# Patient Record
Sex: Female | Born: 1955
Health system: Southern US, Community
[De-identification: ages and names within clinical notes are randomized; demographics above are authoritative.]

## PROBLEM LIST (undated history)

## (undated) DIAGNOSIS — R112 Nausea with vomiting, unspecified: Secondary | ICD-10-CM

## (undated) DIAGNOSIS — J42 Unspecified chronic bronchitis: Secondary | ICD-10-CM

## (undated) DIAGNOSIS — T4145XA Adverse effect of unspecified anesthetic, initial encounter: Secondary | ICD-10-CM

## (undated) DIAGNOSIS — F419 Anxiety disorder, unspecified: Secondary | ICD-10-CM

## (undated) DIAGNOSIS — K219 Gastro-esophageal reflux disease without esophagitis: Secondary | ICD-10-CM

## (undated) DIAGNOSIS — N390 Urinary tract infection, site not specified: Secondary | ICD-10-CM

## (undated) DIAGNOSIS — J189 Pneumonia, unspecified organism: Secondary | ICD-10-CM

## (undated) DIAGNOSIS — M1990 Unspecified osteoarthritis, unspecified site: Secondary | ICD-10-CM

## (undated) DIAGNOSIS — T8859XA Other complications of anesthesia, initial encounter: Secondary | ICD-10-CM

## (undated) DIAGNOSIS — Z9889 Other specified postprocedural states: Secondary | ICD-10-CM

## (undated) HISTORY — PX: TRIGGER FINGER RELEASE: SHX641

## (undated) HISTORY — PX: JOINT REPLACEMENT: SHX530

## (undated) HISTORY — PX: CATARACT EXTRACTION W/ INTRAOCULAR LENS  IMPLANT, BILATERAL: SHX1307

---

## 1979-03-30 HISTORY — PX: TUBAL LIGATION: SHX77

## 1997-03-29 HISTORY — PX: VAGINAL HYSTERECTOMY: SUR661

## 1997-03-29 HISTORY — PX: DILATION AND CURETTAGE OF UTERUS: SHX78

## 2002-03-29 DIAGNOSIS — J189 Pneumonia, unspecified organism: Secondary | ICD-10-CM

## 2002-03-29 HISTORY — DX: Pneumonia, unspecified organism: J18.9

## 2006-03-29 HISTORY — PX: RHINOPLASTY: SUR1284

## 2011-10-02 ENCOUNTER — Other Ambulatory Visit (HOSPITAL_COMMUNITY): Payer: Self-pay | Admitting: Pulmonary Disease

## 2011-10-02 ENCOUNTER — Ambulatory Visit (HOSPITAL_COMMUNITY)
Admission: RE | Admit: 2011-10-02 | Discharge: 2011-10-02 | Disposition: A | Discharge status: Home / Self Care | Payer: BC Managed Care – PPO | Source: Ambulatory Visit | Attending: Pulmonary Disease | Admitting: Pulmonary Disease

## 2011-10-02 DIAGNOSIS — M79609 Pain in unspecified limb: Secondary | ICD-10-CM | POA: Insufficient documentation

## 2011-10-02 DIAGNOSIS — R52 Pain, unspecified: Secondary | ICD-10-CM

## 2011-10-02 DIAGNOSIS — M25569 Pain in unspecified knee: Secondary | ICD-10-CM | POA: Insufficient documentation

## 2014-01-22 ENCOUNTER — Other Ambulatory Visit (HOSPITAL_COMMUNITY): Payer: BC Managed Care – PPO

## 2014-01-30 ENCOUNTER — Inpatient Hospital Stay (HOSPITAL_COMMUNITY)
Admission: RE | Admit: 2014-01-30 | Payer: BC Managed Care – PPO | Source: Ambulatory Visit | Admitting: Orthopedic Surgery

## 2014-01-30 ENCOUNTER — Encounter (HOSPITAL_COMMUNITY): Admission: RE | Payer: Self-pay | Source: Ambulatory Visit

## 2014-01-30 SURGERY — ARTHROPLASTY, KNEE, TOTAL
Anesthesia: General | Site: Knee | Laterality: Left

## 2014-11-05 ENCOUNTER — Ambulatory Visit (HOSPITAL_COMMUNITY)
Admission: RE | Admit: 2014-11-05 | Discharge: 2014-11-05 | Disposition: A | Discharge status: Home / Self Care | Payer: BLUE CROSS/BLUE SHIELD | Source: Ambulatory Visit | Attending: Orthopedic Surgery | Admitting: Orthopedic Surgery

## 2014-11-05 ENCOUNTER — Other Ambulatory Visit (HOSPITAL_COMMUNITY): Payer: Self-pay | Admitting: Orthopedic Surgery

## 2014-11-05 DIAGNOSIS — M7989 Other specified soft tissue disorders: Secondary | ICD-10-CM | POA: Insufficient documentation

## 2014-11-05 DIAGNOSIS — R609 Edema, unspecified: Secondary | ICD-10-CM | POA: Diagnosis not present

## 2014-11-05 NOTE — Progress Notes (Signed)
*  PRELIMINARY RESULTS* Vascular Ultrasound Left lower extremity venous duplex has been completed.  Preliminary findings: negative for DVT. Appears to be ruptured baker's cyst.  Called results to Dr. Renaye Rakers.   Farrel Demark, RDMS, RVT  11/05/2014, 5:44 PM

## 2015-01-09 ENCOUNTER — Ambulatory Visit (INDEPENDENT_AMBULATORY_CARE_PROVIDER_SITE_OTHER): Payer: BLUE CROSS/BLUE SHIELD | Admitting: *Deleted

## 2015-01-09 DIAGNOSIS — Z23 Encounter for immunization: Secondary | ICD-10-CM | POA: Diagnosis not present

## 2015-07-01 DIAGNOSIS — M1712 Unilateral primary osteoarthritis, left knee: Secondary | ICD-10-CM | POA: Diagnosis not present

## 2015-08-01 DIAGNOSIS — L299 Pruritus, unspecified: Secondary | ICD-10-CM | POA: Diagnosis not present

## 2015-08-01 DIAGNOSIS — R030 Elevated blood-pressure reading, without diagnosis of hypertension: Secondary | ICD-10-CM | POA: Diagnosis not present

## 2015-08-01 DIAGNOSIS — L0390 Cellulitis, unspecified: Secondary | ICD-10-CM | POA: Diagnosis not present

## 2015-08-15 DIAGNOSIS — M1712 Unilateral primary osteoarthritis, left knee: Secondary | ICD-10-CM | POA: Diagnosis not present

## 2015-09-01 ENCOUNTER — Other Ambulatory Visit: Payer: Self-pay | Admitting: Physician Assistant

## 2015-09-02 ENCOUNTER — Other Ambulatory Visit: Payer: Self-pay | Admitting: Physician Assistant

## 2015-09-02 DIAGNOSIS — M1712 Unilateral primary osteoarthritis, left knee: Secondary | ICD-10-CM | POA: Diagnosis not present

## 2015-09-02 NOTE — H&P (Signed)
TOTAL KNEE ADMISSION H&P  Patient is being admitted for left total knee arthroplasty.  Subjective:  Chief Complaint:left knee pain.  HPI: Mary Sims, 60 y.o. female, has a history of pain and functional disability in the left knee due to arthritis and has failed non-surgical conservative treatments for greater than 12 weeks to includeNSAID's and/or analgesics, corticosteriod injections and viscosupplementation injections.  Onset of symptoms was gradual, starting 9 years ago with rapidlly worsening course since that time. The patient noted no past surgery on the left knee(s).  Patient currently rates pain in the left knee(s) at 5 out of 10 with activity. Patient has night pain, worsening of pain with activity and weight bearing, pain that interferes with activities of daily living, crepitus and joint swelling.  Patient has evidence of subchondral sclerosis and joint space narrowing by imaging studies. There is no active infection.  There are no active problems to display for this patient.  No past medical history on file.  No past surgical history on file.   (Not in a hospital admission) Allergies not on file  Social History  Substance Use Topics  . Smoking status: Not on file  . Smokeless tobacco: Not on file  . Alcohol Use: Not on file    No family history on file.   Review of Systems  Constitutional: Negative.   HENT: Negative.   Eyes: Negative.   Respiratory: Negative.   Cardiovascular: Negative.   Gastrointestinal: Negative.   Genitourinary: Negative.   Musculoskeletal: Positive for joint pain.  Skin: Negative.   Neurological: Negative.   Endo/Heme/Allergies: Negative.   Psychiatric/Behavioral: Negative.     Objective:  Physical Exam  Constitutional: She is oriented to person, place, and time. She appears well-developed and well-nourished.  HENT:  Head: Normocephalic and atraumatic.  Eyes: EOM are normal. Pupils are equal, round, and reactive to light.  Neck:  Normal range of motion. Neck supple.  Cardiovascular: Normal rate and regular rhythm.   Respiratory: Effort normal and breath sounds normal.  GI: Soft. Bowel sounds are normal.  Musculoskeletal:  Specifically, she has varus thrust and varus alignment, left greater than right.  Tibiofemoral and patellofemoral crepitus, left greater than right.  On the left just about full extension, flexion a little bit better than 90.  She is a little bit better than that on the right.    Neurological: She is alert and oriented to person, place, and time.  Skin: Skin is warm and dry.  Psychiatric: She has a normal mood and affect. Her behavior is normal. Judgment and thought content normal.    Vital signs in last 24 hours: @VSRANGES@  Labs:   There is no height or weight on file to calculate BMI.   Imaging Review Plain radiographs demonstrate severe degenerative joint disease of the left knee(s). The overall alignment ismild varus. The bone quality appears to be fair for age and reported activity level.  Assessment/Plan:  End stage arthritis, left knee   The patient history, physical examination, clinical judgment of the provider and imaging studies are consistent with end stage degenerative joint disease of the left knee(s) and total knee arthroplasty is deemed medically necessary. The treatment options including medical management, injection therapy arthroscopy and arthroplasty were discussed at length. The risks and benefits of total knee arthroplasty were presented and reviewed. The risks due to aseptic loosening, infection, stiffness, patella tracking problems, thromboembolic complications and other imponderables were discussed. The patient acknowledged the explanation, agreed to proceed with the plan and consent was   signed. Patient is being admitted for inpatient treatment for surgery, pain control, PT, OT, prophylactic antibiotics, VTE prophylaxis, progressive ambulation and ADL's and discharge  planning. The patient is planning to be discharged home with home health services

## 2015-09-03 ENCOUNTER — Other Ambulatory Visit (HOSPITAL_COMMUNITY): Payer: Self-pay | Admitting: *Deleted

## 2015-09-03 NOTE — Pre-Procedure Instructions (Signed)
Mary Sims  09/03/2015     Your procedure is scheduled on Wednesday, September 17, 2015 at 10:00 AM.   [facility] at 8:00 AM.   Call this number if you have problems the morning of surgery: [phone]   Any questions prior to day of surgery, please call [phone] between 8 & 4 PM.   Remember:  Do not eat food or drink liquids after midnight Tuesday, 09/16/15.  Take these medicines the morning of surgery with A SIP OF WATER: Alprazolam (Xanax) - if needed, Pantoprazole (Protonix) - if needed  Stop NSAIDS (Meloxicam, Mobic, Aleve, Ibuprofen, etc.) 7 days prior to surgery.   Do not wear jewelry, make-up or nail polish.  Do not wear lotions, powders, or perfumes.  You may wear deodorant.  Do not shave 48 hours prior to surgery.    Do not bring valuables to the hospital.  [facility] any belongings or valuables.  Contacts, dentures or bridgework may not be worn into surgery.  Leave your suitcase in the car.  After surgery it may be brought to your room.  For patients admitted to the hospital, discharge time will be determined by your treatment team.  Special instructions:  [facility] - Preparing for Surgery  Before surgery, you can play an important role.  Because skin is not sterile, your skin needs to be as free of germs as possible.  You can reduce the number of germs on you skin by washing with CHG (chlorahexidine gluconate) soap before surgery.  CHG is an antiseptic cleaner which kills germs and bonds with the skin to continue killing germs even after washing.  Please DO NOT use if you have an allergy to CHG or antibacterial soaps.  If your skin becomes reddened/irritated stop using the CHG and inform your nurse when you arrive at Short Stay.  Do not shave (including legs and underarms) for at least 48 hours prior to the first CHG shower.  You may shave your face.  Please follow these instructions  carefully:   1.  Shower with CHG Soap the night before surgery and the                                morning of Surgery.  2.  If you choose to wash your hair, wash your hair first as usual with your       normal shampoo.  3.  After you shampoo, rinse your hair and body thoroughly to remove the                      Shampoo.  4.  Use CHG as you would any other liquid soap.  You can apply chg directly       to the skin and wash gently with scrungie or a clean washcloth.  5.  Apply the CHG Soap to your body ONLY FROM THE NECK DOWN.        Do not use on open wounds or open sores.  Avoid contact with your eyes, ears, mouth and genitals (private parts).  Wash genitals (private parts) with your normal soap.  6.  Wash thoroughly, paying special attention to the area where your surgery        will be performed.  7.  Thoroughly rinse your body with warm water from the neck down.  8.  DO  NOT shower/wash with your normal soap after using and rinsing off       the CHG Soap.  9.  Pat yourself dry with a clean towel.            10.  Wear clean pajamas.            11.  Place clean sheets on your bed the night of your first shower and do not        sleep with pets.  Day of Surgery  Do not apply any lotions the morning of surgery.  Please wear clean clothes to the hospital.   Please read over the following fact sheets that you were given. Pain Booklet, Coughing and Deep Breathing, MRSA Information and Surgical Site Infection Prevention

## 2015-09-04 ENCOUNTER — Encounter (HOSPITAL_COMMUNITY)
Admission: RE | Admit: 2015-09-04 | Discharge: 2015-09-04 | Disposition: A | Discharge status: Home / Self Care | Payer: BLUE CROSS/BLUE SHIELD | Source: Ambulatory Visit | Attending: Orthopedic Surgery | Admitting: Orthopedic Surgery

## 2015-09-04 ENCOUNTER — Encounter (HOSPITAL_COMMUNITY): Payer: Self-pay

## 2015-09-04 DIAGNOSIS — M1712 Unilateral primary osteoarthritis, left knee: Secondary | ICD-10-CM | POA: Insufficient documentation

## 2015-09-04 DIAGNOSIS — Z01812 Encounter for preprocedural laboratory examination: Secondary | ICD-10-CM | POA: Diagnosis not present

## 2015-09-04 DIAGNOSIS — Z0183 Encounter for blood typing: Secondary | ICD-10-CM | POA: Diagnosis not present

## 2015-09-04 DIAGNOSIS — Z01818 Encounter for other preprocedural examination: Secondary | ICD-10-CM | POA: Insufficient documentation

## 2015-09-04 DIAGNOSIS — K219 Gastro-esophageal reflux disease without esophagitis: Secondary | ICD-10-CM | POA: Insufficient documentation

## 2015-09-04 HISTORY — DX: Unspecified osteoarthritis, unspecified site: M19.90

## 2015-09-04 HISTORY — DX: Gastro-esophageal reflux disease without esophagitis: K21.9

## 2015-09-04 HISTORY — DX: Other specified postprocedural states: Z98.890

## 2015-09-04 HISTORY — DX: Other complications of anesthesia, initial encounter: T88.59XA

## 2015-09-04 HISTORY — DX: Adverse effect of unspecified anesthetic, initial encounter: T41.45XA

## 2015-09-04 HISTORY — DX: Anxiety disorder, unspecified: F41.9

## 2015-09-04 HISTORY — DX: Nausea with vomiting, unspecified: R11.2

## 2015-09-04 LAB — URINALYSIS, ROUTINE W REFLEX MICROSCOPIC
BILIRUBIN URINE: NEGATIVE
GLUCOSE, UA: NEGATIVE mg/dL
Hgb urine dipstick: NEGATIVE
KETONES UR: NEGATIVE mg/dL
Nitrite: NEGATIVE
Protein, ur: NEGATIVE mg/dL
Specific Gravity, Urine: 1.017 (ref 1.005–1.030)
pH: 7 (ref 5.0–8.0)

## 2015-09-04 LAB — CBC
HEMATOCRIT: 41.6 % (ref 36.0–46.0)
Hemoglobin: 12.9 g/dL (ref 12.0–15.0)
MCH: 29.9 pg (ref 26.0–34.0)
MCHC: 31 g/dL (ref 30.0–36.0)
MCV: 96.5 fL (ref 78.0–100.0)
PLATELETS: 234 10*3/uL (ref 150–400)
RBC: 4.31 MIL/uL (ref 3.87–5.11)
RDW: 13.4 % (ref 11.5–15.5)
WBC: 5.7 10*3/uL (ref 4.0–10.5)

## 2015-09-04 LAB — TYPE AND SCREEN
ABO/RH(D): A POS
Antibody Screen: NEGATIVE

## 2015-09-04 LAB — URINE MICROSCOPIC-ADD ON: RBC / HPF: NONE SEEN RBC/hpf (ref 0–5)

## 2015-09-04 LAB — SURGICAL PCR SCREEN
MRSA, PCR: NEGATIVE
Staphylococcus aureus: NEGATIVE

## 2015-09-04 LAB — ABO/RH: ABO/RH(D): A POS

## 2015-09-04 NOTE — Progress Notes (Signed)
Pt denies cardiac history, chest pain or sob. States she has never had any cardiac studies done. Pt's PCP is Dr. Kari BaarsEdward Hawkins.

## 2015-09-05 ENCOUNTER — Other Ambulatory Visit (HOSPITAL_COMMUNITY): Payer: BLUE CROSS/BLUE SHIELD

## 2015-09-05 NOTE — Progress Notes (Addendum)
Anesthesia Chart Review:  Pt is a 60 year old female scheduled for L total knee arthroplasty on 09/17/2015 with Dr. Mckinley Jewelaniel Murphy.   PCP is Dr. Kari BaarsEdward Hawkins.   PMH includes:  Post-op N/V, GERD. Never smoker. BMI 31  Medications include: protonix  Preoperative labs reviewed.    EKG 09/04/15: NSR. Left axis deviation. LBBB.   No old EKG for comparison available. Pt denies every having cardiac testing done. Notified Sherrie in Dr. Greig RightMurphy's office that pt will need to see cardiology prior to surgery.   Rica Mastngela Oluwadara Gorman, FNP-BC [facility]/Anesthesiology Phone: [phone] 09/05/2015 3:36 PM  Addendum:  Pt saw Marcy SalvoBridgette Allison, NP at Dr. Verl DickerGanji's office. Echo and stress test ordered (see below). Pt cleared for surgery.   Nuclear stress test 09/12/15:  1. No evidence ischemia or scar.  2. EF 54%. This is a low risk study.   Echo 09/10/15:  1. LV cavity normal in size. Moderate concentric LVH. Abnormal septal wall motion due to LBBB. Normal diastolic filling pattern. EF 56% 2. LA cavity normal in size 3. Mild to moderate MR 4. Mild TR. No evidence pulmonary HTN.   If no changes, I anticipate pt can proceed with surgery as scheduled.   Rica Mastngela Hoby Kawai, FNP-BC [facility]/Anesthesiology Phone: [phone] 09/16/2015 12:58 PM

## 2015-09-10 DIAGNOSIS — Z01810 Encounter for preprocedural cardiovascular examination: Secondary | ICD-10-CM | POA: Diagnosis not present

## 2015-09-10 DIAGNOSIS — I447 Left bundle-branch block, unspecified: Secondary | ICD-10-CM | POA: Diagnosis not present

## 2015-09-12 DIAGNOSIS — I447 Left bundle-branch block, unspecified: Secondary | ICD-10-CM | POA: Diagnosis not present

## 2015-09-12 DIAGNOSIS — Z01810 Encounter for preprocedural cardiovascular examination: Secondary | ICD-10-CM | POA: Diagnosis not present

## 2015-09-16 MED ORDER — TRANEXAMIC ACID 1000 MG/10ML IV SOLN
1000.0000 mg | INTRAVENOUS | Status: AC
  Administered 2015-09-17: 1000 mg via INTRAVENOUS
  Filled 2015-09-16: qty 10

## 2015-09-16 MED ORDER — VANCOMYCIN HCL IN DEXTROSE 1-5 GM/200ML-% IV SOLN
1000.0000 mg | INTRAVENOUS | Status: AC
  Administered 2015-09-17: 1000 mg via INTRAVENOUS
  Filled 2015-09-16: qty 200

## 2015-09-16 NOTE — Anesthesia Preprocedure Evaluation (Addendum)
Anesthesia Evaluation  Patient identified by MRN, date of birth, ID band Patient awake    Reviewed: Allergy & Precautions, NPO status , Patient's Chart, lab work & pertinent test results  History of Anesthesia Complications (+) PONV and PROLONGED EMERGENCE  Airway Mallampati: II   Neck ROM: Full    Dental  (+) Dental Advisory Given   Pulmonary neg pulmonary ROS,    breath sounds clear to auscultation       Cardiovascular negative cardio ROS   Rhythm:Regular  EKG 08/2014 LBB, Stress and ECHO 08/2015 Normal   Neuro/Psych Anxiety negative neurological ROS  negative psych ROS   GI/Hepatic negative GI ROS, Neg liver ROS, GERD  Medicated,  Endo/Other  negative endocrine ROSObesity BMI 30  Renal/GU negative Renal ROS  negative genitourinary   Musculoskeletal negative musculoskeletal ROS (+)   Abdominal   Peds negative pediatric ROS (+)  Hematology negative hematology ROS (+) 13/41   Anesthesia Other Findings   Reproductive/Obstetrics negative OB ROS                            Anesthesia Physical Anesthesia Plan  ASA: II  Anesthesia Plan: General   Post-op Pain Management:    Induction: Intravenous  Airway Management Planned: Oral ETT  Additional Equipment:   Intra-op Plan:   Post-operative Plan: Extubation in OR  Informed Consent: I have reviewed the patients History and Physical, chart, labs and discussed the procedure including the risks, benefits and alternatives for the proposed anesthesia with the patient or authorized representative who has indicated his/her understanding and acceptance.     Plan Discussed with:   Anesthesia Plan Comments: (Multimodal pain RX)        Anesthesia Quick Evaluation

## 2015-09-17 ENCOUNTER — Encounter (HOSPITAL_COMMUNITY): Payer: Self-pay | Admitting: Surgery

## 2015-09-17 ENCOUNTER — Inpatient Hospital Stay (HOSPITAL_COMMUNITY): Payer: BLUE CROSS/BLUE SHIELD

## 2015-09-17 ENCOUNTER — Encounter (HOSPITAL_COMMUNITY)
Admission: RE | Disposition: A | Discharge status: Home Health | Payer: Self-pay | Source: Ambulatory Visit | Attending: Orthopedic Surgery

## 2015-09-17 ENCOUNTER — Inpatient Hospital Stay (HOSPITAL_COMMUNITY): Payer: BLUE CROSS/BLUE SHIELD | Admitting: Vascular Surgery

## 2015-09-17 ENCOUNTER — Inpatient Hospital Stay (HOSPITAL_COMMUNITY)
Admission: RE | Admit: 2015-09-17 | Discharge: 2015-09-18 | DRG: 470 | Disposition: A | Discharge status: Home Health | Payer: BLUE CROSS/BLUE SHIELD | Source: Ambulatory Visit | Attending: Orthopedic Surgery | Admitting: Orthopedic Surgery

## 2015-09-17 ENCOUNTER — Inpatient Hospital Stay (HOSPITAL_COMMUNITY): Payer: BLUE CROSS/BLUE SHIELD | Admitting: Anesthesiology

## 2015-09-17 DIAGNOSIS — M1712 Unilateral primary osteoarthritis, left knee: Principal | ICD-10-CM | POA: Diagnosis present

## 2015-09-17 DIAGNOSIS — Z96652 Presence of left artificial knee joint: Secondary | ICD-10-CM | POA: Diagnosis not present

## 2015-09-17 DIAGNOSIS — M179 Osteoarthritis of knee, unspecified: Secondary | ICD-10-CM | POA: Diagnosis not present

## 2015-09-17 DIAGNOSIS — D62 Acute posthemorrhagic anemia: Secondary | ICD-10-CM | POA: Diagnosis not present

## 2015-09-17 DIAGNOSIS — F419 Anxiety disorder, unspecified: Secondary | ICD-10-CM | POA: Diagnosis present

## 2015-09-17 DIAGNOSIS — M25562 Pain in left knee: Secondary | ICD-10-CM | POA: Diagnosis not present

## 2015-09-17 DIAGNOSIS — Z471 Aftercare following joint replacement surgery: Secondary | ICD-10-CM | POA: Diagnosis not present

## 2015-09-17 DIAGNOSIS — K219 Gastro-esophageal reflux disease without esophagitis: Secondary | ICD-10-CM | POA: Diagnosis present

## 2015-09-17 DIAGNOSIS — J42 Unspecified chronic bronchitis: Secondary | ICD-10-CM | POA: Diagnosis present

## 2015-09-17 DIAGNOSIS — Z96659 Presence of unspecified artificial knee joint: Secondary | ICD-10-CM

## 2015-09-17 HISTORY — DX: Pneumonia, unspecified organism: J18.9

## 2015-09-17 HISTORY — DX: Urinary tract infection, site not specified: N39.0

## 2015-09-17 HISTORY — DX: Unspecified chronic bronchitis: J42

## 2015-09-17 HISTORY — PX: TOTAL KNEE ARTHROPLASTY: SHX125

## 2015-09-17 SURGERY — ARTHROPLASTY, KNEE, TOTAL
Anesthesia: General | Site: Knee | Laterality: Left

## 2015-09-17 MED ORDER — FENTANYL CITRATE (PF) 100 MCG/2ML IJ SOLN
INTRAMUSCULAR | Status: DC | PRN
  Administered 2015-09-17: 50 ug via INTRAVENOUS
  Administered 2015-09-17 (×3): 100 ug via INTRAVENOUS
  Administered 2015-09-17 (×3): 50 ug via INTRAVENOUS

## 2015-09-17 MED ORDER — SUGAMMADEX SODIUM 200 MG/2ML IV SOLN
INTRAVENOUS | Status: DC | PRN
Start: 2015-09-17 — End: 2015-09-17
  Administered 2015-09-17: 175 mg via INTRAVENOUS

## 2015-09-17 MED ORDER — ACETAMINOPHEN 325 MG PO TABS
ORAL_TABLET | ORAL | Status: AC
  Administered 2015-09-17: 650 mg
  Filled 2015-09-17: qty 2

## 2015-09-17 MED ORDER — BUPIVACAINE HCL 0.5 % IJ SOLN
INTRAMUSCULAR | Status: DC | PRN
  Administered 2015-09-17: 10 mL

## 2015-09-17 MED ORDER — ROCURONIUM BROMIDE 100 MG/10ML IV SOLN
INTRAVENOUS | Status: DC | PRN
  Administered 2015-09-17: 50 mg via INTRAVENOUS

## 2015-09-17 MED ORDER — SODIUM CHLORIDE 0.9 % IR SOLN
Status: DC | PRN
  Administered 2015-09-17: 3000 mL

## 2015-09-17 MED ORDER — PROMETHAZINE HCL 25 MG/ML IJ SOLN: 6.2500 mg | INTRAMUSCULAR | Status: DC | PRN

## 2015-09-17 MED ORDER — CHLORHEXIDINE GLUCONATE 4 % EX LIQD: 60.0000 mL | Freq: Once | CUTANEOUS | Status: DC

## 2015-09-17 MED ORDER — FENTANYL CITRATE (PF) 100 MCG/2ML IJ SOLN
INTRAMUSCULAR | Status: AC
  Administered 2015-09-17: 50 ug via INTRAVENOUS
  Filled 2015-09-17: qty 2

## 2015-09-17 MED ORDER — DOCUSATE SODIUM 100 MG PO CAPS
100.0000 mg | ORAL_CAPSULE | Freq: Two times a day (BID) | ORAL | Status: DC
  Administered 2015-09-17 – 2015-09-18 (×2): 100 mg via ORAL
  Filled 2015-09-17 (×2): qty 1

## 2015-09-17 MED ORDER — APIXABAN 2.5 MG PO TABS: ORAL_TABLET | ORAL | Status: DC

## 2015-09-17 MED ORDER — MIDAZOLAM HCL 5 MG/5ML IJ SOLN
INTRAMUSCULAR | Status: DC | PRN
  Administered 2015-09-17: 2 mg via INTRAVENOUS

## 2015-09-17 MED ORDER — BISACODYL 5 MG PO TBEC: 5.0000 mg | DELAYED_RELEASE_TABLET | Freq: Every day | ORAL | Status: DC | PRN

## 2015-09-17 MED ORDER — ALPRAZOLAM 0.25 MG PO TABS: 0.2500 mg | ORAL_TABLET | Freq: Two times a day (BID) | ORAL | Status: DC | PRN

## 2015-09-17 MED ORDER — PHENOL 1.4 % MT LIQD: 1.0000 | OROMUCOSAL | Status: DC | PRN

## 2015-09-17 MED ORDER — BISACODYL 10 MG RE SUPP: 10.0000 mg | Freq: Every day | RECTAL | Status: DC | PRN

## 2015-09-17 MED ORDER — FENTANYL CITRATE (PF) 250 MCG/5ML IJ SOLN
INTRAMUSCULAR | Status: AC
  Filled 2015-09-17: qty 5

## 2015-09-17 MED ORDER — MEPERIDINE HCL 25 MG/ML IJ SOLN: 6.2500 mg | INTRAMUSCULAR | Status: DC | PRN

## 2015-09-17 MED ORDER — ONDANSETRON HCL 4 MG/2ML IJ SOLN
INTRAMUSCULAR | Status: AC
  Filled 2015-09-17: qty 4

## 2015-09-17 MED ORDER — APIXABAN 2.5 MG PO TABS
2.5000 mg | ORAL_TABLET | Freq: Two times a day (BID) | ORAL | Status: DC
  Administered 2015-09-18: 2.5 mg via ORAL
  Filled 2015-09-17: qty 1

## 2015-09-17 MED ORDER — DIPHENHYDRAMINE HCL 12.5 MG/5ML PO ELIX: 12.5000 mg | ORAL_SOLUTION | ORAL | Status: DC | PRN

## 2015-09-17 MED ORDER — MIDAZOLAM HCL 2 MG/2ML IJ SOLN
INTRAMUSCULAR | Status: AC
  Filled 2015-09-17: qty 2

## 2015-09-17 MED ORDER — ONDANSETRON HCL 4 MG/2ML IJ SOLN
INTRAMUSCULAR | Status: DC | PRN
  Administered 2015-09-17 (×2): 4 mg via INTRAVENOUS

## 2015-09-17 MED ORDER — BUPIVACAINE LIPOSOME 1.3 % IJ SUSP
20.0000 mL | INTRAMUSCULAR | Status: DC
  Filled 2015-09-17: qty 20

## 2015-09-17 MED ORDER — TIZANIDINE HCL 4 MG PO TABS
4.0000 mg | ORAL_TABLET | Freq: Four times a day (QID) | ORAL | Status: DC | PRN
  Administered 2015-09-17 – 2015-09-18 (×2): 4 mg via ORAL
  Filled 2015-09-17 (×2): qty 1

## 2015-09-17 MED ORDER — HYDROMORPHONE HCL 1 MG/ML IJ SOLN
0.5000 mg | INTRAMUSCULAR | Status: DC | PRN
  Administered 2015-09-17 – 2015-09-18 (×2): 1 mg via INTRAVENOUS
  Filled 2015-09-17 (×2): qty 1

## 2015-09-17 MED ORDER — ONDANSETRON HCL 4 MG PO TABS: 4.0000 mg | ORAL_TABLET | Freq: Four times a day (QID) | ORAL | Status: DC | PRN

## 2015-09-17 MED ORDER — POTASSIUM CHLORIDE IN NACL 20-0.9 MEQ/L-% IV SOLN
INTRAVENOUS | Status: DC
  Administered 2015-09-17: 17:00:00 via INTRAVENOUS
  Filled 2015-09-17: qty 1000

## 2015-09-17 MED ORDER — ONDANSETRON HCL 4 MG/2ML IJ SOLN
INTRAMUSCULAR | Status: AC
  Filled 2015-09-17: qty 2

## 2015-09-17 MED ORDER — METOCLOPRAMIDE HCL 5 MG/ML IJ SOLN
5.0000 mg | Freq: Three times a day (TID) | INTRAMUSCULAR | Status: DC | PRN
  Administered 2015-09-17 – 2015-09-18 (×2): 10 mg via INTRAVENOUS
  Filled 2015-09-17 (×2): qty 2

## 2015-09-17 MED ORDER — METOCLOPRAMIDE HCL 5 MG PO TABS: 5.0000 mg | ORAL_TABLET | Freq: Three times a day (TID) | ORAL | Status: DC | PRN

## 2015-09-17 MED ORDER — LACTATED RINGERS IV SOLN: INTRAVENOUS | Status: DC

## 2015-09-17 MED ORDER — CELECOXIB 200 MG PO CAPS
200.0000 mg | ORAL_CAPSULE | Freq: Two times a day (BID) | ORAL | Status: DC
  Administered 2015-09-17 – 2015-09-18 (×2): 200 mg via ORAL
  Filled 2015-09-17 (×2): qty 1

## 2015-09-17 MED ORDER — DEXAMETHASONE SODIUM PHOSPHATE 10 MG/ML IJ SOLN
INTRAMUSCULAR | Status: DC | PRN
  Administered 2015-09-17: 10 mg via INTRAVENOUS

## 2015-09-17 MED ORDER — ONDANSETRON HCL 4 MG PO TABS: 4.0000 mg | ORAL_TABLET | Freq: Three times a day (TID) | ORAL | Status: DC | PRN

## 2015-09-17 MED ORDER — BUPIVACAINE LIPOSOME 1.3 % IJ SUSP
INTRAMUSCULAR | Status: DC | PRN
  Administered 2015-09-17: 20 mL

## 2015-09-17 MED ORDER — ZOLPIDEM TARTRATE 5 MG PO TABS: 5.0000 mg | ORAL_TABLET | Freq: Every evening | ORAL | Status: DC | PRN

## 2015-09-17 MED ORDER — OXYCODONE HCL 5 MG PO TABS
ORAL_TABLET | ORAL | Status: AC
  Administered 2015-09-17: 10 mg via ORAL
  Filled 2015-09-17: qty 2

## 2015-09-17 MED ORDER — MENTHOL 3 MG MT LOZG: 1.0000 | LOZENGE | OROMUCOSAL | Status: DC | PRN

## 2015-09-17 MED ORDER — ACETAMINOPHEN 650 MG RE SUPP: 650.0000 mg | Freq: Four times a day (QID) | RECTAL | Status: DC | PRN

## 2015-09-17 MED ORDER — DEXAMETHASONE SODIUM PHOSPHATE 10 MG/ML IJ SOLN
10.0000 mg | Freq: Once | INTRAMUSCULAR | Status: AC
  Administered 2015-09-18: 10 mg via INTRAVENOUS
  Filled 2015-09-17: qty 1

## 2015-09-17 MED ORDER — PROPOFOL 10 MG/ML IV BOLUS
INTRAVENOUS | Status: DC | PRN
  Administered 2015-09-17: 150 mg via INTRAVENOUS

## 2015-09-17 MED ORDER — ONDANSETRON HCL 4 MG/2ML IJ SOLN
4.0000 mg | Freq: Four times a day (QID) | INTRAMUSCULAR | Status: DC | PRN
  Administered 2015-09-17 (×2): 4 mg via INTRAVENOUS
  Filled 2015-09-17 (×2): qty 2

## 2015-09-17 MED ORDER — OXYCODONE-ACETAMINOPHEN 5-325 MG PO TABS: 1.0000 | ORAL_TABLET | ORAL | Status: DC | PRN

## 2015-09-17 MED ORDER — 0.9 % SODIUM CHLORIDE (POUR BTL) OPTIME
TOPICAL | Status: DC | PRN
  Administered 2015-09-17: 1000 mL

## 2015-09-17 MED ORDER — ROCURONIUM BROMIDE 50 MG/5ML IV SOLN
INTRAVENOUS | Status: AC
  Filled 2015-09-17: qty 1

## 2015-09-17 MED ORDER — FENTANYL CITRATE (PF) 100 MCG/2ML IJ SOLN
INTRAMUSCULAR | Status: AC
  Filled 2015-09-17: qty 2

## 2015-09-17 MED ORDER — VANCOMYCIN HCL IN DEXTROSE 1-5 GM/200ML-% IV SOLN
1000.0000 mg | Freq: Two times a day (BID) | INTRAVENOUS | Status: AC
  Administered 2015-09-18: 1000 mg via INTRAVENOUS
  Filled 2015-09-17: qty 200

## 2015-09-17 MED ORDER — PANTOPRAZOLE SODIUM 40 MG PO TBEC
40.0000 mg | DELAYED_RELEASE_TABLET | Freq: Every day | ORAL | Status: DC
  Administered 2015-09-18: 40 mg via ORAL
  Filled 2015-09-17: qty 1

## 2015-09-17 MED ORDER — DEXAMETHASONE SODIUM PHOSPHATE 10 MG/ML IJ SOLN
INTRAMUSCULAR | Status: AC
  Filled 2015-09-17: qty 1

## 2015-09-17 MED ORDER — OXYCODONE HCL 5 MG PO TABS
5.0000 mg | ORAL_TABLET | ORAL | Status: DC | PRN
  Administered 2015-09-17 – 2015-09-18 (×4): 10 mg via ORAL
  Filled 2015-09-17 (×4): qty 2

## 2015-09-17 MED ORDER — ACETAMINOPHEN 325 MG PO TABS: 650.0000 mg | ORAL_TABLET | Freq: Four times a day (QID) | ORAL | Status: DC | PRN

## 2015-09-17 MED ORDER — POLYETHYLENE GLYCOL 3350 17 G PO PACK: 17.0000 g | PACK | Freq: Every day | ORAL | Status: DC | PRN

## 2015-09-17 MED ORDER — FENTANYL CITRATE (PF) 100 MCG/2ML IJ SOLN
25.0000 ug | INTRAMUSCULAR | Status: DC | PRN
  Administered 2015-09-17 (×3): 50 ug via INTRAVENOUS

## 2015-09-17 MED ORDER — LIDOCAINE 2% (20 MG/ML) 5 ML SYRINGE
INTRAMUSCULAR | Status: AC
  Filled 2015-09-17: qty 5

## 2015-09-17 MED ORDER — PROPOFOL 10 MG/ML IV BOLUS
INTRAVENOUS | Status: AC
  Filled 2015-09-17: qty 20

## 2015-09-17 MED ORDER — LACTATED RINGERS IV SOLN
INTRAVENOUS | Status: DC
  Administered 2015-09-17 (×2): via INTRAVENOUS

## 2015-09-17 MED ORDER — LIDOCAINE HCL (CARDIAC) 20 MG/ML IV SOLN
INTRAVENOUS | Status: DC | PRN
  Administered 2015-09-17: 80 mg via INTRAVENOUS

## 2015-09-17 SURGICAL SUPPLY — 61 items
BANDAGE ELASTIC 4 VELCRO ST LF (GAUZE/BANDAGES/DRESSINGS) ×2 IMPLANT
BANDAGE ELASTIC 6 VELCRO ST LF (GAUZE/BANDAGES/DRESSINGS) ×2 IMPLANT
BANDAGE ESMARK 6X9 LF (GAUZE/BANDAGES/DRESSINGS) ×1 IMPLANT
BENZOIN TINCTURE PRP APPL 2/3 (GAUZE/BANDAGES/DRESSINGS) ×2 IMPLANT
BLADE SAG 18X100X1.27 (BLADE) ×4 IMPLANT
BNDG ESMARK 6X9 LF (GAUZE/BANDAGES/DRESSINGS) ×2
BOWL SMART MIX CTS (DISPOSABLE) ×2 IMPLANT
CAPT KNEE TOTAL 3 ×2 IMPLANT
CEMENT BONE SIMPLEX SPEEDSET (Cement) ×4 IMPLANT
COVER SURGICAL LIGHT HANDLE (MISCELLANEOUS) ×2 IMPLANT
CUFF TOURNIQUET SINGLE 34IN LL (TOURNIQUET CUFF) ×2 IMPLANT
DRAPE EXTREMITY T 121X128X90 (DRAPE) ×2 IMPLANT
DRAPE IMP U-DRAPE 54X76 (DRAPES) ×2 IMPLANT
DRAPE PROXIMA HALF (DRAPES) ×2 IMPLANT
DRAPE U-SHAPE 47X51 STRL (DRAPES) ×2 IMPLANT
DRSG PAD ABDOMINAL 8X10 ST (GAUZE/BANDAGES/DRESSINGS) ×2 IMPLANT
DURAPREP 26ML APPLICATOR (WOUND CARE) ×2 IMPLANT
ELECT CAUTERY BLADE 6.4 (BLADE) ×2 IMPLANT
ELECT REM PT RETURN 9FT ADLT (ELECTROSURGICAL) ×2
ELECTRODE REM PT RTRN 9FT ADLT (ELECTROSURGICAL) ×1 IMPLANT
EVACUATOR 1/8 PVC DRAIN (DRAIN) ×2 IMPLANT
FACESHIELD WRAPAROUND (MASK) ×4 IMPLANT
GAUZE SPONGE 4X4 12PLY STRL (GAUZE/BANDAGES/DRESSINGS) ×2 IMPLANT
GLOVE BIOGEL PI IND STRL 7.0 (GLOVE) ×1 IMPLANT
GLOVE BIOGEL PI INDICATOR 7.0 (GLOVE) ×1
GLOVE ORTHO TXT STRL SZ7.5 (GLOVE) ×2 IMPLANT
GLOVE SURG ORTHO 7.0 STRL STRW (GLOVE) ×2 IMPLANT
GOWN STRL REUS W/ TWL LRG LVL3 (GOWN DISPOSABLE) ×3 IMPLANT
GOWN STRL REUS W/ TWL XL LVL3 (GOWN DISPOSABLE) ×2 IMPLANT
GOWN STRL REUS W/TWL LRG LVL3 (GOWN DISPOSABLE) ×3
GOWN STRL REUS W/TWL XL LVL3 (GOWN DISPOSABLE) ×2
HANDPIECE INTERPULSE COAX TIP (DISPOSABLE) ×1
IMMOBILIZER KNEE 22 UNIV (SOFTGOODS) ×2 IMPLANT
IMMOBILIZER KNEE 24 THIGH 36 (MISCELLANEOUS) IMPLANT
IMMOBILIZER KNEE 24 UNIV (MISCELLANEOUS)
KIT BASIN OR (CUSTOM PROCEDURE TRAY) ×2 IMPLANT
KIT ROOM TURNOVER OR (KITS) ×2 IMPLANT
MANIFOLD NEPTUNE II (INSTRUMENTS) ×2 IMPLANT
NEEDLE 18GX1X1/2 (RX/OR ONLY) (NEEDLE) ×2 IMPLANT
NEEDLE HYPO 25GX1X1/2 BEV (NEEDLE) ×2 IMPLANT
NS IRRIG 1000ML POUR BTL (IV SOLUTION) ×2 IMPLANT
PACK TOTAL JOINT (CUSTOM PROCEDURE TRAY) ×2 IMPLANT
PACK UNIVERSAL I (CUSTOM PROCEDURE TRAY) ×2 IMPLANT
PAD ARMBOARD 7.5X6 YLW CONV (MISCELLANEOUS) ×4 IMPLANT
PAD CAST 4YDX4 CTTN HI CHSV (CAST SUPPLIES) ×1 IMPLANT
PADDING CAST COTTON 4X4 STRL (CAST SUPPLIES) ×1
SET HNDPC FAN SPRY TIP SCT (DISPOSABLE) ×1 IMPLANT
STRIP CLOSURE SKIN 1/2X4 (GAUZE/BANDAGES/DRESSINGS) ×2 IMPLANT
SUCTION FRAZIER HANDLE 10FR (MISCELLANEOUS)
SUCTION TUBE FRAZIER 10FR DISP (MISCELLANEOUS) IMPLANT
SUT MNCRL AB 4-0 PS2 18 (SUTURE) ×2 IMPLANT
SUT VIC AB 0 CT1 27 (SUTURE) ×1
SUT VIC AB 0 CT1 27XBRD ANBCTR (SUTURE) ×1 IMPLANT
SUT VIC AB 1 CTX 36 (SUTURE) ×2
SUT VIC AB 1 CTX36XBRD ANBCTR (SUTURE) ×2 IMPLANT
SUT VIC AB 2-0 CT1 27 (SUTURE) ×2
SUT VIC AB 2-0 CT1 TAPERPNT 27 (SUTURE) ×2 IMPLANT
SYR 50ML LL SCALE MARK (SYRINGE) ×2 IMPLANT
SYR CONTROL 10ML LL (SYRINGE) ×2 IMPLANT
TOWEL OR 17X24 6PK STRL BLUE (TOWEL DISPOSABLE) ×2 IMPLANT
TOWEL OR 17X26 10 PK STRL BLUE (TOWEL DISPOSABLE) ×2 IMPLANT

## 2015-09-17 NOTE — Interval H&P Note (Signed)
History and Physical Interval Note:  09/17/2015 8:25 AM  [name]  has presented today for surgery, with the diagnosis of DJD LEFT KNEE  The various methods of treatment have been discussed with the patient and family. After consideration of risks, benefits and other options for treatment, the patient has consented to  Procedure(s): TOTAL KNEE ARTHROPLASTY (Left) as a surgical intervention .  The patient's history has been reviewed, patient examined, no change in status, stable for surgery.  I have reviewed the patient's chart and labs.  Questions were answered to the patient's satisfaction.     Loreta Aveaniel F Chrys Landgrebe

## 2015-09-17 NOTE — Anesthesia Procedure Notes (Signed)
Procedure Name: Intubation Date/Time: 09/17/2015 11:20 AM Performed by: Army FossaPULLIAM, Wesson Stith DANE Pre-anesthesia Checklist: Patient identified, Emergency Drugs available, Suction available, Patient being monitored and Timeout performed Patient Re-evaluated:Patient Re-evaluated prior to inductionOxygen Delivery Method: Circle system utilized Preoxygenation: Pre-oxygenation with 100% oxygen Intubation Type: IV induction Ventilation: Mask ventilation without difficulty and Oral airway inserted - appropriate to patient size Laryngoscope Size: Mac and 3 Grade View: Grade III Tube type: Oral Tube size: 6.5 mm Number of attempts: 1 Airway Equipment and Method: Patient positioned with wedge pillow and Stylet Placement Confirmation: ETT inserted through vocal cords under direct vision,  positive ETCO2,  CO2 detector and breath sounds checked- equal and bilateral Secured at: 21 cm Tube secured with: Tape Dental Injury: Teeth and Oropharynx as per pre-operative assessment

## 2015-09-17 NOTE — H&P (View-Only) (Signed)
TOTAL KNEE ADMISSION H&P  Patient is being admitted for left total knee arthroplasty.  Subjective:  Chief Complaint:left knee pain.  HPI: Mary Sims, 60 y.o. female, has a history of pain and functional disability in the left knee due to arthritis and has failed non-surgical conservative treatments for greater than 12 weeks to includeNSAID's and/or analgesics, corticosteriod injections and viscosupplementation injections.  Onset of symptoms was gradual, starting 9 years ago with rapidlly worsening course since that time. The patient noted no past surgery on the left knee(s).  Patient currently rates pain in the left knee(s) at 5 out of 10 with activity. Patient has night pain, worsening of pain with activity and weight bearing, pain that interferes with activities of daily living, crepitus and joint swelling.  Patient has evidence of subchondral sclerosis and joint space narrowing by imaging studies. There is no active infection.  There are no active problems to display for this patient.  No past medical history on file.  No past surgical history on file.   (Not in a hospital admission) Allergies not on file  Social History  Substance Use Topics  . Smoking status: Not on file  . Smokeless tobacco: Not on file  . Alcohol Use: Not on file    No family history on file.   Review of Systems  Constitutional: Negative.   HENT: Negative.   Eyes: Negative.   Respiratory: Negative.   Cardiovascular: Negative.   Gastrointestinal: Negative.   Genitourinary: Negative.   Musculoskeletal: Positive for joint pain.  Skin: Negative.   Neurological: Negative.   Endo/Heme/Allergies: Negative.   Psychiatric/Behavioral: Negative.     Objective:  Physical Exam  Constitutional: She is oriented to person, place, and time. She appears well-developed and well-nourished.  HENT:  Head: Normocephalic and atraumatic.  Eyes: EOM are normal. Pupils are equal, round, and reactive to light.  Neck:  Normal range of motion. Neck supple.  Cardiovascular: Normal rate and regular rhythm.   Respiratory: Effort normal and breath sounds normal.  GI: Soft. Bowel sounds are normal.  Musculoskeletal:  Specifically, she has varus thrust and varus alignment, left greater than right.  Tibiofemoral and patellofemoral crepitus, left greater than right.  On the left just about full extension, flexion a little bit better than 90.  She is a little bit better than that on the right.    Neurological: She is alert and oriented to person, place, and time.  Skin: Skin is warm and dry.  Psychiatric: She has a normal mood and affect. Her behavior is normal. Judgment and thought content normal.    Vital signs in last 24 hours: @  Labs:   There is no height or weight on file to calculate BMI.   Imaging Review Plain radiographs demonstrate severe degenerative joint disease of the left knee(s). The overall alignment ismild varus. The bone quality appears to be fair for age and reported activity level.  Assessment/Plan:  End stage arthritis, left knee   The patient history, physical examination, clinical judgment of the provider and imaging studies are consistent with end stage degenerative joint disease of the left knee(s) and total knee arthroplasty is deemed medically necessary. The treatment options including medical management, injection therapy arthroscopy and arthroplasty were discussed at length. The risks and benefits of total knee arthroplasty were presented and reviewed. The risks due to aseptic loosening, infection, stiffness, patella tracking problems, thromboembolic complications and other imponderables were discussed. The patient acknowledged the explanation, agreed to proceed with the plan and consent was  signed. Patient is being admitted for inpatient treatment for surgery, pain control, PT, OT, prophylactic antibiotics, VTE prophylaxis, progressive ambulation and ADL's and discharge  planning. The patient is planning to be discharged home with home health services

## 2015-09-17 NOTE — Anesthesia Postprocedure Evaluation (Signed)
Anesthesia Post Note  Patient: Mary Sims  Procedure(s) Performed: Procedure(s) (LRB): TOTAL KNEE ARTHROPLASTY (Left)  Patient location during evaluation: PACU Anesthesia Type: General Level of consciousness: awake and alert Pain management: pain level controlled Vital Signs Assessment: post-procedure vital signs reviewed and stable Respiratory status: spontaneous breathing, nonlabored ventilation, respiratory function stable and patient connected to nasal cannula oxygen Cardiovascular status: blood pressure returned to baseline and stable Postop Assessment: no signs of nausea or vomiting Anesthetic complications: no    Last Vitals:  Filed Vitals:   09/17/15 1300 09/17/15 1315  BP: 158/98 116/104  Pulse: 78   Temp:    Resp:  18    Last Pain:  Filed Vitals:   09/17/15 1319  PainSc: 9                  Sebastian Acheheodore Maddoxx Burkitt

## 2015-09-17 NOTE — Transfer of Care (Signed)
Immediate Anesthesia Transfer of Care Note  Patient: Mary Sims  Procedure(s) Performed: Procedure(s): TOTAL KNEE ARTHROPLASTY (Left)  Patient Location: PACU  Anesthesia Type:General  Level of Consciousness:  sedated, patient cooperative and responds to stimulation  Airway & Oxygen Therapy:Patient Spontanous Breathing and Patient connected to face mask oxgen  Post-op Assessment:  Report given to PACU RN and Post -op Vital signs reviewed and stable  Post vital signs:  Reviewed and stable  Last Vitals:  Filed Vitals:   09/17/15 0929  BP: 163/90  Pulse: 72  Temp: 36.7 C  Resp: 18    Complications: No apparent anesthesia complications

## 2015-09-17 NOTE — Evaluation (Signed)
Physical Therapy Evaluation Patient Details Name: Mary Sims MRN: [id number] DOB: 07-Jul-1955 Today's Date: 09/17/2015   History of Present Illness  Pt presents for L TKA after many years of OA. PMH: GERD, anxiety  Clinical Impression  Pt is s/p TKA resulting in the deficits listed below (see PT Problem List). Pt transferred bed to chair with min A and RW, did not progress further due to pt nausea.  Pt will benefit from skilled PT to increase their independence and safety with mobility to allow discharge to the venue listed below.      Follow Up Recommendations Home health PT    Equipment Recommendations  3in1 (PT)    Recommendations for Other Services       Precautions / Restrictions Precautions Precautions: Knee Precaution Comments: reviewed proper positioning Restrictions Weight Bearing Restrictions: Yes LLE Weight Bearing: Weight bearing as tolerated      Mobility  Bed Mobility Overal bed mobility: Needs Assistance Bed Mobility: Supine to Sit     Supine to sit: Min assist     General bed mobility comments: vc's for sequencing, pt able to lift leg enought o slide it off bed, min A to lower it slowly to floor  Transfers Overall transfer level: Needs assistance Equipment used: Rolling walker (2 wheeled) Transfers: Sit to/from [employer]Stand;Stand Pivot Transfers Sit to Stand: Min assist Stand pivot transfers: Min assist       General transfer comment: vc's for hand placement  Ambulation/Gait             General Gait Details: NT due to pt with much nausea  Stairs            Wheelchair Mobility    Modified Rankin (Stroke Patients Only)       Balance Overall balance assessment: No apparent balance deficits (not formally assessed)                                           Pertinent Vitals/Pain Pain Assessment: Faces Faces Pain Scale: Hurts even more Pain Location: left knee Pain Descriptors / Indicators: Aching Pain  Intervention(s): Limited activity within patient's tolerance;Monitored during session;Premedicated before session;Repositioned    Home Living Family/patient expects to be discharged to:: Private residence Living Arrangements: Spouse/significant other Available Help at Discharge: Family;Available 24 hours/day Type of Home: House Home Access: Stairs to enter Entrance Stairs-Rails: Right Entrance Stairs-Number of Steps: 4 Home Layout: Multi-level Home Equipment: Walker - 2 wheels;Wheelchair - manual;Cane - single point Additional Comments: pt has split foyer with 8 steps but once she is up those she can stay on that level    Prior Function Level of Independence: Independent         Comments: pt works as Haematologistbank teller. Husband had skull fx at work in April so is currently not working and will be home with her     Hand Dominance        Extremity/Trunk Assessment   Upper Extremity Assessment: Defer to OT evaluation           Lower Extremity Assessment: LLE deficits/detail   LLE Deficits / Details: hip flex 3/5, knee ext 3/5  Cervical / Trunk Assessment: Normal  Communication   Communication: No difficulties  Cognition Arousal/Alertness: Awake/alert Behavior During Therapy: WFL for tasks assessed/performed Overall Cognitive Status: Within Functional Limits for tasks assessed  General Comments General comments (skin integrity, edema, etc.): pt left in zero knee foam    Exercises Total Joint Exercises Ankle Circles/Pumps: AROM;Both;10 reps;Seated Quad Sets: AROM;Both;10 reps;Seated      Assessment/Plan    PT Assessment Patient needs continued PT services  PT Diagnosis Difficulty walking;Acute pain;Abnormality of gait   PT Problem List Decreased strength;Decreased range of motion;Decreased mobility;Decreased knowledge of use of DME;Decreased knowledge of precautions;Pain  PT Treatment Interventions DME instruction;Gait training;Stair  training;Functional mobility training;Therapeutic activities;Therapeutic exercise;Neuromuscular re-education;Patient/family education   PT Goals (Current goals can be found in the Care Plan section) Acute Rehab PT Goals Patient Stated Goal: return to work PT Goal Formulation: With patient Time For Goal Achievement: 09/24/15 Potential to Achieve Goals: Good    Frequency 7X/week   Barriers to discharge Inaccessible home environment will need to practice stairs    Co-evaluation               End of Session Equipment Utilized During Treatment: Gait belt Activity Tolerance: Patient tolerated treatment well Patient left: in chair;with call bell/phone within reach Nurse Communication: Mobility status         Time: 1610-9604 PT Time Calculation (min) (ACUTE ONLY): 20 min   Charges:   PT Evaluation $PT Eval Low Complexity: 1 Procedure     PT G Codes:      Lyanne Co, PT  Acute Rehab Services  [phone]   Lyanne Co 09/17/2015, 4:41 PM

## 2015-09-17 NOTE — Discharge Instructions (Signed)

## 2015-09-17 NOTE — Discharge Summary (Addendum)
Patient ID: Mary Sims MRN: [id number] DOB/AGE: February 01, 1956 60 y.o.  Admit date: 09/17/2015 Discharge date: 09/18/2015  Admission Diagnoses:  Active Problems:   S/P total knee replacement   Discharge Diagnoses:  Same  Past Medical History  Diagnosis Date  . Anxiety   . GERD (gastroesophageal reflux disease)   . Complication of anesthesia     slow to wake up after rhinoplasty  . PONV (postoperative nausea and vomiting)   . Walking pneumonia 2004  . Chronic bronchitis (HCC)   . Arthritis     "elbows, hands, knees, thumbs" (09/17/2015)  . Frequent UTI     "1-2 times/year; every year" (09/17/2015)    Surgeries: Procedure(s): TOTAL KNEE ARTHROPLASTY on 09/17/2015   Consultants:    Discharged Condition: Improved  Hospital Course: Mary Seamela Bodnar is an 60 y.o. female who was admitted 09/17/2015 for operative treatment of primary localized osteoarthritis left knee. Patient has severe unremitting pain that affects sleep, daily activities, and work/hobbies. After pre-op clearance the patient was taken to the operating room on 09/17/2015 and underwent  Procedure(s): TOTAL KNEE ARTHROPLASTY.  Patient with a pre-op Hb of 12.4 developed ABLA on pod#1 with a Hb of 12.0.  She is currently asymptomatic but we will continue to follow.  Patient was given perioperative antibiotics:      Anti-infectives    Start     Dose/Rate Route Frequency Ordered Stop   09/17/15 2300  vancomycin (VANCOCIN) IVPB 1000 mg/200 mL premix     1,000 mg 200 mL/hr over 60 Minutes Intravenous Every 12 hours 09/17/15 1333 09/18/15 0102   09/17/15 1030  vancomycin (VANCOCIN) IVPB 1000 mg/200 mL premix     1,000 mg 200 mL/hr over 60 Minutes Intravenous To ShortStay Surgical 09/16/15 1456 09/17/15 1203       Patient was given sequential compression devices, early ambulation, and chemoprophylaxis to prevent DVT.  Patient benefited maximally from hospital stay and there were no complications.    Recent vital signs:   Patient Vitals for the past 24 hrs:  BP Temp Temp src Pulse Resp SpO2  09/18/15 0521 110/72 mmHg 98 F (36.7 C) Oral 68 17 98 %  09/18/15 0037 100/60 mmHg 98.6 F (37 C) Oral 73 17 98 %  09/17/15 2113 (!) 154/86 mmHg 98.6 F (37 C) Oral 91 17 100 %  09/17/15 1400 - 97.9 F (36.6 C) - 74 - 100 %  09/17/15 1345 (!) 150/89 mmHg - - 73 - 100 %  09/17/15 1330 (!) 150/91 mmHg - - 78 - 100 %  09/17/15 1315 (!) 116/104 mmHg - - 76 18 100 %  09/17/15 1300 (!) 158/98 mmHg - - 78 - -  09/17/15 1250 (!) 164/97 mmHg 97.7 F (36.5 C) - 98 18 100 %     Recent laboratory studies:   Recent Labs  09/18/15 0703  WBC 12.7*  HGB 12.0  HCT 37.9  PLT 287  NA 139  K 3.6  CL 106  CO2 26  BUN 8  CREATININE 0.77  GLUCOSE 129*  CALCIUM 9.2     Discharge Medications:     Medication List    STOP taking these medications        ibuprofen 200 MG tablet  Commonly known as:  ADVIL,MOTRIN     meloxicam 7.5 MG tablet  Commonly known as:  MOBIC      TAKE these medications        ALPRAZolam 0.25 MG tablet  Commonly known as:  XANAX  Take  1 tablet by mouth 2 (two) times daily as needed.     apixaban 2.5 MG Tabs tablet  Commonly known as:  ELIQUIS  Take 1 tab po q12 hours x 14 days following surgery to prevent blood clots     bisacodyl 5 MG EC tablet  Commonly known as:  DULCOLAX  Take 1 tablet (5 mg total) by mouth daily as needed for moderate constipation.     ondansetron 4 MG tablet  Commonly known as:  ZOFRAN  Take 1 tablet (4 mg total) by mouth every 8 (eight) hours as needed for nausea or vomiting.     oxyCODONE-acetaminophen 5-325 MG tablet  Commonly known as:  ROXICET  Take 1-2 tablets by mouth every 4 (four) hours as needed.     pantoprazole 40 MG tablet  Commonly known as:  PROTONIX  Take 1 tablet by mouth daily as needed.     tiZANidine 4 MG tablet  Commonly known as:  ZANAFLEX  Take 1 tablet by mouth every 6 (six) hours as needed for muscle spasms.         Diagnostic Studies: Dg Knee Left Port  09/17/2015  CLINICAL DATA:  Status post left knee arthroplasty. EXAM: PORTABLE LEFT KNEE - 1-2 VIEW COMPARISON:  10/02/2011. FINDINGS: The femoral and tibial prosthetic components are well-seated and aligned. There is no acute fracture or evidence of an operative complication. IMPRESSION: Well-positioned left knee arthroplasty. Electronically Signed   By: Amie Portland M.D.   On: 09/17/2015 18:08    Disposition: Final discharge disposition not confirmed    Follow-up Information    Follow up with Loreta Ave, MD. Schedule an appointment as soon as possible for a visit in 2 weeks.   Specialty:  Orthopedic Surgery   Contact information:   [address] [place] [address] [phone]        Signed: Otilio Saber 09/18/2015, 10:20 AM

## 2015-09-17 NOTE — Progress Notes (Signed)
Report given to jamie rn as caregiver 

## 2015-09-17 NOTE — Progress Notes (Signed)
Orthopedic Tech Progress Note Patient Details:  Mary Sims April 25, 1955 [id number]  CPM Left Knee CPM Left Knee: On Left Knee Flexion (Degrees): 90 Left Knee Extension (Degrees): 0 Additional Comments: trapeze bar patient helper Viewed order from doctor's order list  Nikki DomCrawford, Ileane Sando 09/17/2015, 1:13 PM

## 2015-09-17 NOTE — Op Note (Signed)
NAMConcha Se:  Sims, Mary Sims               ACCOUNT NO.:  [id number]  MEDICAL RECORD NO.:  [id number]  LOCATION:  [facility]                         FACILITY:  [facility]  PHYSICIAN:  Loreta Aveaniel F. Halayna Blane, M.D. DATE OF BIRTH:  1955-08-04  DATE OF PROCEDURE:  09/17/2015 DATE OF DISCHARGE:                              OPERATIVE REPORT   PREOPERATIVE DIAGNOSIS:  Left knee end-stage arthritis, primary generalized.  POSTOPERATIVE DIAGNOSIS:  Left knee end-stage arthritis, primary generalized.  PROCEDURE:  Modified minimally invasive left total knee replacement with Stryker triathlon prosthesis.  Cemented pegged cruciate retained #4 femoral component.  Cemented #4 tibial component, 9-mm CS insert. Cemented resurfacing 32-mm patellar component.  SURGEON:  Loreta Aveaniel F. Lovelyn Sheeran, M.D.  ASSISTANT:  Mikey KirschnerLindsey Stanberry, PA, present throughout the entire case and necessary for timely completion of procedure.  ANESTHESIA:  General.  BLOOD LOSS:  Minimal.  SPECIMENS:  None.  CULTURES:  None.  COMPLICATIONS:  None.  DRESSINGS:  Soft compressive knee immobilizer.  TOURNIQUET TIME:  50 minutes.  DESCRIPTION OF PROCEDURE:  The patient was brought to the operating room.  After adequate anesthesia had been obtained, tourniquet applied. Prepped and draped in usual sterile fashion.  Exsanguinated with elevation of Esmarch and tourniquet inflated to 350 mmHg.  Straight incision above the patella down to tibial tubercle.  Medial arthrotomy, vastus splitting.  Knee exposed.  Flexible intramedullary guide distal femur.  An 8-mm resection 5 degrees of valgus.  Using epicondylar axis, the femur was sized, cut, and fitted for a pegged cruciate retaining #4 component.  Proximal tibial resection with extramedullary guide.  Size #4 component.  Utilizing 9-mm CS insert.  Patella exposed, posterior 10 mm removed, drilled, sized, and fitted for a 32-mm component.  With trials in place, I was pleased with balancing, stability, and  alignment of patellar tracking.  Tibia was marked for rotation and hand reamed. All trials removed.  Copious irrigation with pulse irrigating device. Cement prepared, placed on all components, firmly seated.  Polyethylene attached to the tibia and knee reduced.  Patella held with a clamp. Once the cement hardened, the knee was irrigated again.  Soft tissues injected with Exparel.  Arthrotomy closed with #1 Vicryl with a subcutaneous and subcuticular closure.  Margins were injected with Marcaine.  Sterile compressive dressing applied. Tourniquet deflated and removed.  Knee immobilizer applied.  Anesthesia reversed.  Brought to the recovery room.  Tolerated the surgery well. No complications.     Loreta Aveaniel F. Radha Coggins, M.D.     DFM/MEDQ  D:  09/17/2015  T:  09/17/2015  Job:  [id number]

## 2015-09-18 ENCOUNTER — Encounter (HOSPITAL_COMMUNITY): Payer: Self-pay | Admitting: Orthopedic Surgery

## 2015-09-18 LAB — BASIC METABOLIC PANEL
ANION GAP: 7 (ref 5–15)
BUN: 8 mg/dL (ref 6–20)
CHLORIDE: 106 mmol/L (ref 101–111)
CO2: 26 mmol/L (ref 22–32)
Calcium: 9.2 mg/dL (ref 8.9–10.3)
Creatinine, Ser: 0.77 mg/dL (ref 0.44–1.00)
GFR calc non Af Amer: >60 mL/min (ref >60)
Glucose, Bld: 129 mg/dL — ABNORMAL HIGH (ref 65–99)
POTASSIUM: 3.6 mmol/L (ref 3.5–5.1)
Sodium: 139 mmol/L (ref 135–145)

## 2015-09-18 LAB — CBC
HEMATOCRIT: 37.9 % (ref 36.0–46.0)
HEMOGLOBIN: 12 g/dL (ref 12.0–15.0)
MCH: 30.1 pg (ref 26.0–34.0)
MCHC: 31.7 g/dL (ref 30.0–36.0)
MCV: 95 fL (ref 78.0–100.0)
Platelets: 287 10*3/uL (ref 150–400)
RBC: 3.99 MIL/uL (ref 3.87–5.11)
RDW: 13.3 % (ref 11.5–15.5)
WBC: 12.7 10*3/uL — AB (ref 4.0–10.5)

## 2015-09-18 MED ORDER — HYDROCODONE-ACETAMINOPHEN 7.5-325 MG PO TABS
1.0000 | ORAL_TABLET | ORAL | Status: DC | PRN
  Administered 2015-09-18: 2 via ORAL
  Administered 2015-09-18: 1 via ORAL
  Administered 2015-09-18: 2 via ORAL
  Filled 2015-09-18: qty 2
  Filled 2015-09-18: qty 1
  Filled 2015-09-18: qty 2

## 2015-09-18 NOTE — Progress Notes (Signed)
Reviewed discharge instructions/medication with patient.  Answered all her questions.

## 2015-09-18 NOTE — Progress Notes (Signed)
Physical Therapy Treatment Patient Details Name: Mary Sims MRN: [id number] DOB: 1955-08-09 Today's Date: 09/18/2015    History of Present Illness Pt presents for L TKA after many years of OA. PMH: GERD, anxiety    PT Comments    Pt performed increased gait, reviewed stair training and HEP.  Pt to d/c this pm.    Follow Up Recommendations  Home health PT     Equipment Recommendations  3in1 (PT)    Recommendations for Other Services       Precautions / Restrictions Precautions Precautions: Knee Precaution Comments: reviewed proper positioning Restrictions Weight Bearing Restrictions: Yes LLE Weight Bearing: Weight bearing as tolerated    Mobility  Bed Mobility               General bed mobility comments: Pt received in recliner chair on arrival.  OT ending session.    Transfers Overall transfer level: Needs assistance Equipment used: Rolling walker (2 wheeled) Transfers: Sit to/from Stand Sit to Stand: Supervision Stand pivot transfers: Supervision       General transfer comment: No cues required demonstrated good technique.    Ambulation/Gait Ambulation/Gait assistance: Supervision Ambulation Distance (Feet): 450 Feet Assistive device: Rolling walker (2 wheeled) Gait Pattern/deviations: Step-through pattern;Antalgic;Decreased step length - left     General Gait Details: Cues for sequencing, Cues for L heel strike and cues for increasing weight shifting to L to encourage gait symmetry.    Stairs Stairs: Yes Stairs assistance: Supervision Stair Management: Two rails Number of Stairs: 12 General stair comments: Cues for sequencing and hand placement on railing.    Wheelchair Mobility    Modified Rankin (Stroke Patients Only)       Balance Overall balance assessment: Needs assistance   Sitting balance-Leahy Scale: Good       Standing balance-Leahy Scale: Fair                      Cognition Arousal/Alertness:  Awake/alert Behavior During Therapy: WFL for tasks assessed/performed Overall Cognitive Status: Within Functional Limits for tasks assessed                      Exercises Total Joint Exercises Ankle Circles/Pumps: AROM;Both;10 reps;Supine Quad Sets: AROM;Left;10 reps;Supine Towel Squeeze: AROM;Both;10 reps;Supine Heel Slides: AROM;Left;10 reps;Supine Hip ABduction/ADduction: AROM;Left;10 reps;Supine Straight Leg Raises: AROM;Left;10 reps;Supine Long Arc Quad: AROM;Left;10 reps;Seated Knee Flexion: AAROM;Left;10 reps (with 10 sec hold.  )    General Comments        Pertinent Vitals/Pain Pain Assessment: 0-10 Pain Score: 7  Pain Location: L knee pain, quad pain.   Pain Descriptors / Indicators: Aching;Guarding;Grimacing;Burning Pain Intervention(s): Monitored during session;Repositioned;Ice applied    Home Living                      Prior Function            PT Goals (current goals can now be found in the care plan section) Acute Rehab PT Goals Patient Stated Goal: return to work Potential to Achieve Goals: Good    Frequency  7X/week    PT Plan Current plan remains appropriate    Co-evaluation             End of Session Equipment Utilized During Treatment: Gait belt Activity Tolerance: Patient tolerated treatment well Patient left: in chair;with call bell/phone within reach     Time: 1615-1640 PT Time Calculation (min) (ACUTE ONLY): 25 min  Charges:  $Gait Training:  8-22 mins $Therapeutic Exercise: 8-22 mins                    G Codes:      Mary Sims 09/18/2015, 5:32 PM  Mary Sims, PTA pager [phone]

## 2015-09-18 NOTE — Progress Notes (Signed)
Occupational Therapy Evaluation Patient Details Name: Mary Sims MRN: [id number] DOB: 06-Apr-1955 Today's Date: 09/18/2015    History of Present Illness Pt presents for L TKA after many years of OA. PMH: GERD, anxiety   Clinical Impression   Pt admitted with the above diagnoses and presents with below problem list. Pt will benefit from continued acute OT to address the below listed deficits and maximize independence with BADLs prior to d/c home. PTA pt was independent with ADLs. Pt is currently min guard for LB ADLs and functional mobility/transfers.      Follow Up Recommendations  No OT follow up;Supervision - Intermittent    Equipment Recommendations  3 in 1 bedside comode    Recommendations for Other Services       Precautions / Restrictions Precautions Precautions: Knee Restrictions Weight Bearing Restrictions: Yes LLE Weight Bearing: Weight bearing as tolerated      Mobility Bed Mobility               General bed mobility comments: up in chair  Transfers Overall transfer level: Needs assistance Equipment used: Rolling walker (2 wheeled) Transfers: Sit to/from Stand Sit to Stand: Min guard         General transfer comment: min guard for safety    Balance Overall balance assessment: No apparent balance deficits (not formally assessed)                                          ADL Overall ADL's : Needs assistance/impaired Eating/Feeding: Set up;Sitting   Grooming: Set up;Sitting;Min guard;Standing   Upper Body Bathing: Set up;Sitting   Lower Body Bathing: Min guard;Sit to/from stand   Upper Body Dressing : Set up;Sitting   Lower Body Dressing: Min guard;Sit to/from stand   Toilet Transfer: Min guard;Ambulation;RW;BSC   Toileting- ArchitectClothing Manipulation and Hygiene: Min guard;Sit to/from stand   Tub/ Shower Transfer: Min guard;Walk-in shower;Ambulation;3 in 1;Rolling walker   Functional mobility during ADLs: Min  guard;Rolling walker General ADL Comments: Able to reach down BLE to access feet in seated position. Educated on techniques and DME for ADLs. [facility] aand completed pericare as detailed above.     Vision     Perception     Praxis      Pertinent Vitals/Pain Pain Assessment: 0-10 Pain Score: 6  Pain Location: left knee Pain Descriptors / Indicators: Aching Pain Intervention(s): Monitored during session;Limited activity within patient's tolerance;Repositioned;Premedicated before session     Hand Dominance     Extremity/Trunk Assessment Upper Extremity Assessment Upper Extremity Assessment: Overall WFL for tasks assessed           Communication Communication Communication: No difficulties   Cognition Arousal/Alertness: Awake/alert Behavior During Therapy: WFL for tasks assessed/performed Overall Cognitive Status: Within Functional Limits for tasks assessed                     General Comments       Exercises       Shoulder Instructions      Home Living Family/patient expects to be discharged to:: Private residence Living Arrangements: Spouse/significant other Available Help at Discharge: Family;Available 24 hours/day Type of Home: House Home Access: Stairs to enter Entergy CorporationEntrance Stairs-Number of Steps: 4 Entrance Stairs-Rails: Right Home Layout: Multi-level Alternate Level Stairs-Number of Steps: 8 Alternate Level Stairs-Rails: Right Bathroom Shower/Tub: Producer, television/film/videoWalk-in shower   Bathroom Toilet: Standard  Home Equipment: Walker - 2 wheels;Wheelchair - manual;Cane - single point   Additional Comments: pt has split foyer with 8 steps but once she is up those she can stay on that level      Prior Functioning/Environment Level of Independence: Independent        Comments: pt works as Haematologistbank teller. Husband had skull fx at work in April so is currently not working and will be home with her    OT Diagnosis: Acute pain   OT Problem List:  Decreased knowledge of use of DME or AE;Decreased knowledge of precautions;Pain   OT Treatment/Interventions: Self-care/ADL training;DME and/or AE instruction;Therapeutic activities;Patient/family education;Balance training    OT Goals(Current goals can be found in the care plan section) Acute Rehab OT Goals Patient Stated Goal: return to work OT Goal Formulation: With patient Potential to Achieve Goals: Good ADL Goals Pt Will Transfer to Toilet: with modified independence;ambulating (3n1 over toilet) Pt Will Perform Toileting - Clothing Manipulation and hygiene: with modified independence;sit to/from stand;sitting/lateral leans Pt Will Perform Tub/Shower Transfer: Shower transfer;with modified independence;ambulating;3 in 1;rolling walker  OT Frequency: Min 1X/week   Barriers to D/C:            Co-evaluation              End of Session Equipment Utilized During Treatment: Rolling walker CPM Left Knee CPM Left Knee: Off  Activity Tolerance: Patient tolerated treatment well Patient left: in chair;with call bell/phone within reach;Other (comment) (with PT)   Time: 1610-96040904-0919 OT Time Calculation (min): 15 min Charges:  OT General Charges $OT Visit: 1 Procedure OT Evaluation $OT Eval Low Complexity: 1 Procedure G-Codes:    Pilar GrammesMathews, Yaroslav Gombos H 09/18/2015, 9:34 AM

## 2015-09-18 NOTE — Progress Notes (Signed)
Subjective: 1 Day Post-Op Procedure(s) (LRB): TOTAL KNEE ARTHROPLASTY (Left) Patient reports pain as mild.  Main issue has been nausea.  No vomiting.  No lightheadedness/dizziness, chest pain/sob.    Objective: Vital signs in last 24 hours: Temp:  [97.7 F (36.5 C)-98.6 F (37 C)] 98 F (36.7 C) (06/22 0521) Pulse Rate:  [68-98] 68 (06/22 0521) Resp:  [17-18] 17 (06/22 0521) BP: (100-164)/(60-104) 110/72 mmHg (06/22 0521) SpO2:  [98 %-100 %] 98 % (06/22 0521) Weight:  [81.599 kg (179 lb 14.3 oz)] 81.599 kg (179 lb 14.3 oz) (06/21 0929)  Intake/Output from previous day: 06/21 0701 - 06/22 0700 In: 2580 [P.O.:360; I.V.:2220] Out: 50 [Blood:50] Intake/Output this shift:    No results for input(s): HGB in the last 72 hours. No results for input(s): WBC, RBC, HCT, PLT in the last 72 hours. No results for input(s): NA, K, CL, CO2, BUN, CREATININE, GLUCOSE, CALCIUM in the last 72 hours. No results for input(s): LABPT, INR in the last 72 hours.  Neurologically intact Neurovascular intact Sensation intact distally Intact pulses distally Dorsiflexion/Plantar flexion intact Compartment soft  Assessment/Plan: 1 Day Post-Op Procedure(s) (LRB): TOTAL KNEE ARTHROPLASTY (Left) Advance diet Up with therapy Plan for discharge tomorrow home with hhpt WBAT LLE Dry dressing change prn Will d/c oxy and dilaudid and add norco as patient tolerates this at home   Mary Sims 09/18/2015, 7:11 AM

## 2015-09-18 NOTE — Progress Notes (Signed)
Orthopedic Tech Progress Note Patient Details:  Mary Sims 12-Feb-1956 [id number]  CPM Left Knee CPM Left Knee: On Left Knee Flexion (Degrees): 90 Left Knee Extension (Degrees): 0 Additional Comments: trapeze bar patient helper   [name] 09/18/2015, 3:04 PM

## 2015-09-18 NOTE — Care Management Note (Signed)
Case Management Note  Patient Details  Name: Mary Sims MRN: [id number] Date of Birth: 21-May-1955  Subjective/Objective:   60 yr old female s/p left total knee arthroplasty.             Action/Plan: Case manager spoke with patient concerning home health and DME needs at discharge. Choice was offered for Home Health agency, referral was called to Dor[facility] Church[facility] Fel[facility], [facility] Liaison. Patient has rolling walker, 3in1 and CPM will be delivered to her home. Patient will have family support at discharge.     Expected Discharge Date:    09/18/15              Expected Discharge Plan:  Home w Home Health Services  In-House Referral:     Discharge planning Services  CM Consult  Post Acute Care Choice:  Durable Medical Equipment, Home Health Choice offered to:  Patient  DME Arranged:  3-N-1, CPM [facility]  HH Arranged:  PT [facility]  Status of Service:  Completed, signed off  If discussed at [employer] Meetings, dates discussed:    Additional Comments:  Durenda GuthrieBrady, Dellene Mcgroarty Naomi, RN 09/18/2015, 2:21 PM

## 2015-09-18 NOTE — Progress Notes (Signed)
Physical Therapy Treatment Patient Details Name: Mary Sims MRN: [id number] DOB: 07-04-1955 Today's Date: 09/18/2015    History of Present Illness Pt presents for L TKA after many years of OA. PMH: GERD, anxiety    PT Comments    Pt progressed to gait training.  No bouts of nausea.  Pt performed increased activity and tolerated tx well.    Follow Up Recommendations  Home health PT     Equipment Recommendations  3in1 (PT)    Recommendations for Other Services       Precautions / Restrictions Precautions Precautions: Knee Precaution Comments: reviewed proper positioning Restrictions Weight Bearing Restrictions: Yes LLE Weight Bearing: Weight bearing as tolerated    Mobility  Bed Mobility               General bed mobility comments: Pt received in recliner chair on arrival.  OT ending session.    Transfers Overall transfer level: Needs assistance Equipment used: Rolling walker (2 wheeled) Transfers: Sit to/from Stand Sit to Stand: Min guard Stand pivot transfers: Min guard       General transfer comment: Pt required min guard for safety.  Nno cues required demonstrated good technique.    Ambulation/Gait Ambulation/Gait assistance: Min guard Ambulation Distance (Feet): 300 Feet Assistive device: Rolling walker (2 wheeled) Gait Pattern/deviations: Step-through pattern;Antalgic;Decreased step length - left     General Gait Details: Cues for sequencing, Cues for L heel strike and cues for increasing weight shifting to L to encourage gait symmetry.  Pt fatigues with gait training.  No nausea noted.      Stairs            Wheelchair Mobility    Modified Rankin (Stroke Patients Only)       Balance Overall balance assessment: Needs assistance   Sitting balance-Leahy Scale: Good       Standing balance-Leahy Scale: Fair                      Cognition Arousal/Alertness: Awake/alert Behavior During Therapy: WFL for tasks  assessed/performed Overall Cognitive Status: Within Functional Limits for tasks assessed                      Exercises Total Joint Exercises Ankle Circles/Pumps: AROM;Both;10 reps;Supine Quad Sets: AROM;Left;10 reps;Supine Towel Squeeze: AROM;Both;10 reps;Supine Heel Slides: AROM;Left;10 reps;Supine Hip ABduction/ADduction: AROM;Left;10 reps;Supine Straight Leg Raises: AROM;Left;10 reps;Supine Long Arc Quad: AROM;Left;10 reps;Seated Goniometric ROM: 12-70 degrees.  AROM L knee.      General Comments        Pertinent Vitals/Pain Pain Assessment: 0-10 Pain Score: 7  Pain Location: L knee pain and greater in L quad.   Pain Descriptors / Indicators: Aching;Grimacing;Guarding;Tightness Pain Intervention(s): Monitored during session;Repositioned;Ice applied    Home Living Family/patient expects to be discharged to:: Private residence Living Arrangements: Spouse/significant other Available Help at Discharge: Family;Available 24 hours/day Type of Home: House Home Access: Stairs to enter Entrance Stairs-Rails: Right Home Layout: Multi-level Home Equipment: Walker - 2 wheels;Wheelchair - manual;Cane - single point Additional Comments: pt has split foyer with 8 steps but once she is up those she can stay on that level    Prior Function Level of Independence: Independent      Comments: pt works as Haematologistbank teller. Husband had skull fx at work in April so is currently not working and will be home with her   PT Goals (current goals can now be found in the care plan section) Acute Rehab  PT Goals Patient Stated Goal: return to work Potential to Achieve Goals: Good Progress towards PT goals: Progressing toward goals    Frequency       PT Plan Current plan remains appropriate    Co-evaluation             End of Session Equipment Utilized During Treatment: Gait belt Activity Tolerance: Patient tolerated treatment well Patient left: in chair;with call bell/phone within  reach     Time: 0917-0945 PT Time Calculation (min) (ACUTE ONLY): 28 min  Charges:  $Gait Training: 8-22 mins $Therapeutic Exercise: 8-22 mins                    G Codes:      Florestine Aversimee J Schylar Wuebker 09/18/2015, 9:53 AM Joycelyn RuaAimee Cathern Tahir, PTA pager [phone]

## 2015-09-19 DIAGNOSIS — Z96652 Presence of left artificial knee joint: Secondary | ICD-10-CM | POA: Diagnosis not present

## 2015-09-19 DIAGNOSIS — F419 Anxiety disorder, unspecified: Secondary | ICD-10-CM | POA: Diagnosis not present

## 2015-09-19 DIAGNOSIS — Z471 Aftercare following joint replacement surgery: Secondary | ICD-10-CM | POA: Diagnosis not present

## 2015-09-19 DIAGNOSIS — Z7901 Long term (current) use of anticoagulants: Secondary | ICD-10-CM | POA: Diagnosis not present

## 2015-09-19 DIAGNOSIS — M150 Primary generalized (osteo)arthritis: Secondary | ICD-10-CM | POA: Diagnosis not present

## 2015-09-19 DIAGNOSIS — Z87440 Personal history of urinary (tract) infections: Secondary | ICD-10-CM | POA: Diagnosis not present

## 2015-09-19 DIAGNOSIS — J42 Unspecified chronic bronchitis: Secondary | ICD-10-CM | POA: Diagnosis not present

## 2015-09-19 DIAGNOSIS — K219 Gastro-esophageal reflux disease without esophagitis: Secondary | ICD-10-CM | POA: Diagnosis not present

## 2015-09-22 DIAGNOSIS — Z7901 Long term (current) use of anticoagulants: Secondary | ICD-10-CM | POA: Diagnosis not present

## 2015-09-22 DIAGNOSIS — J42 Unspecified chronic bronchitis: Secondary | ICD-10-CM | POA: Diagnosis not present

## 2015-09-22 DIAGNOSIS — Z87440 Personal history of urinary (tract) infections: Secondary | ICD-10-CM | POA: Diagnosis not present

## 2015-09-22 DIAGNOSIS — F419 Anxiety disorder, unspecified: Secondary | ICD-10-CM | POA: Diagnosis not present

## 2015-09-22 DIAGNOSIS — M150 Primary generalized (osteo)arthritis: Secondary | ICD-10-CM | POA: Diagnosis not present

## 2015-09-22 DIAGNOSIS — Z96652 Presence of left artificial knee joint: Secondary | ICD-10-CM | POA: Diagnosis not present

## 2015-09-22 DIAGNOSIS — K219 Gastro-esophageal reflux disease without esophagitis: Secondary | ICD-10-CM | POA: Diagnosis not present

## 2015-09-22 DIAGNOSIS — Z471 Aftercare following joint replacement surgery: Secondary | ICD-10-CM | POA: Diagnosis not present

## 2015-09-24 DIAGNOSIS — J42 Unspecified chronic bronchitis: Secondary | ICD-10-CM | POA: Diagnosis not present

## 2015-09-24 DIAGNOSIS — F419 Anxiety disorder, unspecified: Secondary | ICD-10-CM | POA: Diagnosis not present

## 2015-09-24 DIAGNOSIS — M150 Primary generalized (osteo)arthritis: Secondary | ICD-10-CM | POA: Diagnosis not present

## 2015-09-24 DIAGNOSIS — Z471 Aftercare following joint replacement surgery: Secondary | ICD-10-CM | POA: Diagnosis not present

## 2015-09-24 DIAGNOSIS — Z96652 Presence of left artificial knee joint: Secondary | ICD-10-CM | POA: Diagnosis not present

## 2015-09-24 DIAGNOSIS — Z7901 Long term (current) use of anticoagulants: Secondary | ICD-10-CM | POA: Diagnosis not present

## 2015-09-24 DIAGNOSIS — Z87440 Personal history of urinary (tract) infections: Secondary | ICD-10-CM | POA: Diagnosis not present

## 2015-09-24 DIAGNOSIS — K219 Gastro-esophageal reflux disease without esophagitis: Secondary | ICD-10-CM | POA: Diagnosis not present

## 2015-09-26 DIAGNOSIS — M150 Primary generalized (osteo)arthritis: Secondary | ICD-10-CM | POA: Diagnosis not present

## 2015-09-26 DIAGNOSIS — K219 Gastro-esophageal reflux disease without esophagitis: Secondary | ICD-10-CM | POA: Diagnosis not present

## 2015-09-26 DIAGNOSIS — F419 Anxiety disorder, unspecified: Secondary | ICD-10-CM | POA: Diagnosis not present

## 2015-09-26 DIAGNOSIS — J42 Unspecified chronic bronchitis: Secondary | ICD-10-CM | POA: Diagnosis not present

## 2015-09-26 DIAGNOSIS — Z96652 Presence of left artificial knee joint: Secondary | ICD-10-CM | POA: Diagnosis not present

## 2015-09-26 DIAGNOSIS — Z7901 Long term (current) use of anticoagulants: Secondary | ICD-10-CM | POA: Diagnosis not present

## 2015-09-26 DIAGNOSIS — Z87440 Personal history of urinary (tract) infections: Secondary | ICD-10-CM | POA: Diagnosis not present

## 2015-09-26 DIAGNOSIS — Z471 Aftercare following joint replacement surgery: Secondary | ICD-10-CM | POA: Diagnosis not present

## 2015-09-29 DIAGNOSIS — J42 Unspecified chronic bronchitis: Secondary | ICD-10-CM | POA: Diagnosis not present

## 2015-09-29 DIAGNOSIS — Z87440 Personal history of urinary (tract) infections: Secondary | ICD-10-CM | POA: Diagnosis not present

## 2015-09-29 DIAGNOSIS — M150 Primary generalized (osteo)arthritis: Secondary | ICD-10-CM | POA: Diagnosis not present

## 2015-09-29 DIAGNOSIS — Z7901 Long term (current) use of anticoagulants: Secondary | ICD-10-CM | POA: Diagnosis not present

## 2015-09-29 DIAGNOSIS — K219 Gastro-esophageal reflux disease without esophagitis: Secondary | ICD-10-CM | POA: Diagnosis not present

## 2015-09-29 DIAGNOSIS — F419 Anxiety disorder, unspecified: Secondary | ICD-10-CM | POA: Diagnosis not present

## 2015-09-29 DIAGNOSIS — Z471 Aftercare following joint replacement surgery: Secondary | ICD-10-CM | POA: Diagnosis not present

## 2015-09-29 DIAGNOSIS — Z96652 Presence of left artificial knee joint: Secondary | ICD-10-CM | POA: Diagnosis not present

## 2015-10-01 ENCOUNTER — Ambulatory Visit: Payer: BLUE CROSS/BLUE SHIELD | Admitting: Internal Medicine

## 2015-10-01 DIAGNOSIS — Z7901 Long term (current) use of anticoagulants: Secondary | ICD-10-CM | POA: Diagnosis not present

## 2015-10-01 DIAGNOSIS — J42 Unspecified chronic bronchitis: Secondary | ICD-10-CM | POA: Diagnosis not present

## 2015-10-01 DIAGNOSIS — Z471 Aftercare following joint replacement surgery: Secondary | ICD-10-CM | POA: Diagnosis not present

## 2015-10-01 DIAGNOSIS — K219 Gastro-esophageal reflux disease without esophagitis: Secondary | ICD-10-CM | POA: Diagnosis not present

## 2015-10-01 DIAGNOSIS — F419 Anxiety disorder, unspecified: Secondary | ICD-10-CM | POA: Diagnosis not present

## 2015-10-01 DIAGNOSIS — Z96652 Presence of left artificial knee joint: Secondary | ICD-10-CM | POA: Diagnosis not present

## 2015-10-01 DIAGNOSIS — M150 Primary generalized (osteo)arthritis: Secondary | ICD-10-CM | POA: Diagnosis not present

## 2015-10-01 DIAGNOSIS — Z87440 Personal history of urinary (tract) infections: Secondary | ICD-10-CM | POA: Diagnosis not present

## 2015-10-03 DIAGNOSIS — M1712 Unilateral primary osteoarthritis, left knee: Secondary | ICD-10-CM | POA: Diagnosis not present

## 2015-10-13 ENCOUNTER — Ambulatory Visit: Payer: BLUE CROSS/BLUE SHIELD | Attending: Orthopedic Surgery | Admitting: Physical Therapy

## 2015-10-13 DIAGNOSIS — R600 Localized edema: Secondary | ICD-10-CM | POA: Insufficient documentation

## 2015-10-13 DIAGNOSIS — M25562 Pain in left knee: Secondary | ICD-10-CM

## 2015-10-13 DIAGNOSIS — M25662 Stiffness of left knee, not elsewhere classified: Secondary | ICD-10-CM

## 2015-10-13 NOTE — Therapy (Signed)
[facility] [address] [place], [place], [address] Phone: [phone]   Fax:  [phone]  Physical Therapy Evaluation  Patient Details  Name: Mary Sims MRN: [id number] Date of Birth: September 11, 1955 Referring Provider: Mckinley Jewel MD  Encounter Date: 10/13/2015      PT End of Session - 10/13/15 0939    Visit Number 1   Number of Visits 16   Date for PT Re-Evaluation 12/08/15   PT Start Time 0904   Activity Tolerance Patient tolerated treatment well   Behavior During Therapy [facility] for tasks assessed/performed      Past Medical History  Diagnosis Date  . Anxiety   . GERD (gastroesophageal reflux disease)   . Complication of anesthesia     slow to wake up after rhinoplasty  . PONV (postoperative nausea and vomiting)   . Walking pneumonia 2004  . Chronic bronchitis (HCC)   . Arthritis     "elbows, hands, knees, thumbs" (09/17/2015)  . Frequent UTI     "1-2 times/year; every year" (09/17/2015)    Past Surgical History  Procedure Laterality Date  . Cataract extraction w/ intraocular lens  implant, bilateral Bilateral 2000s  . Trigger finger release Left ~ 2003    thumb  . Vaginal hysterectomy  1999  . Rhinoplasty  2008  . Total knee arthroplasty Left 09/17/2015  . Joint replacement    . Dilation and curettage of uterus  1999  . Tubal ligation  1981  . Total knee arthroplasty Left 09/17/2015    Procedure: TOTAL KNEE ARTHROPLASTY;  Surgeon: Loreta Ave, MD;  Location: [facility] OR;  Service: Orthopedics;  Laterality: Left;    There were no vitals filed for this visit.       Subjective Assessment - 10/13/15 0936    Subjective I am very pleased with how I am doing.  I was off the walker in a week.   Patient Stated Goals Get back to normal.   Currently in Pain? Yes   Pain Score 4    Pain Location Knee   Pain Orientation Left   Pain Descriptors / Indicators Aching;Numbness   Pain Type Surgical pain   Pain Onset More than  a month ago   Pain Frequency Constant   Aggravating Factors  Riding in car or sleeping.            [facility] PT Assessment - 10/13/15 0001    Assessment   Medical Diagnosis Left total knee replacement.   Referring Provider Mckinley Jewel MD   Onset Date/Surgical Date --  09/17/15 (surgery date).   Precautions   Precaution Comments No ultrasound.   Restrictions   Weight Bearing Restrictions No   Balance Screen   Has the patient fallen in the past 6 months No   Has the patient had a decrease in activity level because of a fear of falling?  No   Is the patient reluctant to leave their home because of a fear of falling?  No   Home Environment   Living Environment Private residence   Home Access Stairs to enter   Entrance Stairs-Rails Right;Left   Prior Function   Level of Independence Independent   Observation/Other Assessments   Observations Sterile bandaids covering incision.   Observation/Other Assessments-Edema    Edema Circumferential   Circumferential Edema   Circumferential - Right Left 4.5 cms    ROM / Strength   AROM / PROM / Strength AROM;Strength   AROM   Overall AROM Comments Full  active left knee extension with active flexion to 95 degrees and passive to 102 degrees.   Strength   Overall Strength Comments Left hip and knee strength= 4+/5.   Palpation   Patella mobility Minimal decrease in left patellar mobility in all directions.   Palpation comment Tender to palpation over medial left knee region and c/o lateral side numbness.   Transfers   Transfers Sit to Stand   Sit to Stand 7: Independent   Ambulation/Gait   Gait Comments The patient is walking without an assistive device and her gait pattern is essentially normal.                                PT Long Term Goals - 10/13/15 0958    PT LONG TERM GOAL #1   Title Ind with a HEP.   Time 8   Period Weeks   Status New   PT LONG TERM GOAL #2   Title Active knee flexion to 120-125  degrees+ so the patient can perform functional tasks and do so with pain not > 2-3/10.   Time 8   Period Weeks   Status New   PT LONG TERM GOAL #3   Title Decrease edema to within 2 cms of non-affected side to assist with pain reduction and range of motion gains.   Time 8   Period Weeks   Status New   PT LONG TERM GOAL #4   Title Perform a reciprocating stair gait with one railing with pain not > 2-3/10.   Time 8   Period Weeks   Status New               Plan - 10/13/15 0940    Clinical Impression Statement The patient underwent a left total knee replacement on 09/17/15.  She is very pleased with her progress thus fr and presented to the clinic without an assistive device.  She had some home health and did very well.  Her pain-level is a 4/10 today but rise to higher levels (5-6/10) when sleeping and riding in a car.     Rehab Potential Excellent   PT Frequency 2x / week   PT Duration 8 weeks   PT Treatment/Interventions ADLs/Self Care Home Management;Cryotherapy;Electrical Stimulation;Therapeutic exercise;Therapeutic activities;Patient/family education;Manual techniques;Vasopneumatic Device;Passive range of motion   PT Next Visit Plan Nustep and progression to bike; Left patellar mobility; strengthening; Electrical stimulation and vasopneumatic.   Consulted and Agree with Plan of Care Patient      Patient will benefit from skilled therapeutic intervention in order to improve the following deficits and impairments:  Pain, Decreased activity tolerance, Decreased strength, Increased edema, Decreased range of motion  Visit Diagnosis: Pain in left knee - Plan: PT plan of care cert/re-cert  Stiffness of left knee, not elsewhere classified - Plan: PT plan of care cert/re-cert  Localized edema - Plan: PT plan of care cert/re-cert     Problem List Patient Active Problem List   Diagnosis Date Noted  . S/P total knee replacement 09/17/2015    APPLE[place], [place] MPT 10/13/2015,  10:03 AM  [facility] [address] [place], [place], [address] Phone: [phone]   Fax:  [phone]  Name: Mary Sims MRN: [id number] Date of Birth: 1956-03-16

## 2015-10-16 ENCOUNTER — Ambulatory Visit: Payer: BLUE CROSS/BLUE SHIELD | Admitting: Physical Therapy

## 2015-10-16 ENCOUNTER — Encounter: Payer: Self-pay | Admitting: Physical Therapy

## 2015-10-16 DIAGNOSIS — R600 Localized edema: Secondary | ICD-10-CM

## 2015-10-16 DIAGNOSIS — M25562 Pain in left knee: Secondary | ICD-10-CM | POA: Diagnosis not present

## 2015-10-16 DIAGNOSIS — M25662 Stiffness of left knee, not elsewhere classified: Secondary | ICD-10-CM | POA: Diagnosis not present

## 2015-10-16 NOTE — Therapy (Signed)
[facility] [address] [place], [place], [address] Phone: [phone]   Fax:  [phone]  Physical Therapy Treatment  Patient Details  Name: Mary Sims MRN: [id number] Date of Birth: 1955/06/25 Referring Provider: Mckinley Jewelaniel Murphy MD  Encounter Date: 10/16/2015      PT End of Session - 10/16/15 0901    Visit Number 2   Number of Visits 16   Date for PT Re-Evaluation 12/08/15   PT Start Time 0903   PT Stop Time 0951   PT Time Calculation (min) 48 min   Activity Tolerance Patient tolerated treatment well   Behavior During Therapy [facility] for tasks assessed/performed      Past Medical History  Diagnosis Date  . Anxiety   . GERD (gastroesophageal reflux disease)   . Complication of anesthesia     slow to wake up after rhinoplasty  . PONV (postoperative nausea and vomiting)   . Walking pneumonia 2004  . Chronic bronchitis (HCC)   . Arthritis     "elbows, hands, knees, thumbs" (09/17/2015)  . Frequent UTI     "1-2 times/year; every year" (09/17/2015)    Past Surgical History  Procedure Laterality Date  . Cataract extraction w/ intraocular lens  implant, bilateral Bilateral 2000s  . Trigger finger release Left ~ 2003    thumb  . Vaginal hysterectomy  1999  . Rhinoplasty  2008  . Total knee arthroplasty Left 09/17/2015  . Joint replacement    . Dilation and curettage of uterus  1999  . Tubal ligation  1981  . Total knee arthroplasty Left 09/17/2015    Procedure: TOTAL KNEE ARTHROPLASTY;  Surgeon: Loreta Aveaniel F Murphy, MD;  [facility];  Service: Orthopedics;  Laterality: Left;    There were no vitals filed for this visit.      Subjective Assessment - 10/16/15 0901    Subjective Reports that her surgery was four weeks ago. Reports that at her incision it just feels tight. Reports that she is a stomach sleeper and cannot tolerate sleeping that way long. Reports that she has many stairs throughout her home and thinks that  ambulating the stairs and walking her dog has really helped her following this surgery. Reports being able to ascend stairs good but descending is more difficult.   Patient Stated Goals Get back to normal.   Currently in Pain? Yes   Pain Score 2    Pain Location Knee   Pain Orientation Left   Pain Descriptors / Indicators Tightness   Pain Type Surgical pain   Pain Onset More than a month ago            [facility] PT Assessment - 10/16/15 0001    Assessment   Medical Diagnosis Left total knee replacement.   Onset Date/Surgical Date 09/17/15   Precautions   Precaution Comments No ultrasound.   Restrictions   Weight Bearing Restrictions No   ROM / Strength   AROM / PROM / Strength AROM   AROM   Overall AROM  Deficits   AROM Assessment Site Knee   Right/Left Knee Left   Left Knee Extension -7   Left Knee Flexion 110                     [facility] Adult PT Treatment/Exercise - 10/16/15 0001    Exercises   Exercises Knee/Hip   Knee/Hip Exercises: Stretches   Active Hamstring Stretch Left;3 reps;30 seconds   Knee/Hip Exercises: Aerobic   Nustep L4  x11 min, seat 8   Knee/Hip Exercises: Standing   Forward Lunges Left;2 sets;10 reps;3 seconds   Rocker Board 3 minutes   Knee/Hip Exercises: Seated   Long Arc Quad Strengthening;Left;2 sets;10 reps;Weights   Long Arc Quad Weight 4 lbs.   Modalities   Modalities Office manager Location L knee   Electrical Stimulation Action IFC   Electrical Stimulation Parameters 1-10 hz x15 min   Electrical Stimulation Goals Pain;Edema   Vasopneumatic   Number Minutes Vasopneumatic  15 minutes   Vasopnuematic Location  Knee   Vasopneumatic Pressure Medium   Vasopneumatic Temperature  44   Manual Therapy   Manual Therapy Soft tissue mobilization   Soft tissue mobilization L knee incision and patella mobilizations to promote proper mobility in supine                      PT Long Term Goals - 10/13/15 1610    PT LONG TERM GOAL #1   Title Ind with a HEP.   Time 8   Period Weeks   Status New   PT LONG TERM GOAL #2   Title Active knee flexion to 120-125 degrees+ so the patient can perform functional tasks and do so with pain not > 2-3/10.   Time 8   Period Weeks   Status New   PT LONG TERM GOAL #3   Title Decrease edema to within 2 cms of non-affected side to assist with pain reduction and range of motion gains.   Time 8   Period Weeks   Status New   PT LONG TERM GOAL #4   Title Perform a reciprocating stair gait with one railing with pain not > 2-3/10.   Time 8   Period Weeks   Status New               Plan - 10/16/15 0940    Clinical Impression Statement Patient tolerated today's treatment well today with no reports of increased pain with any of the exercises. Patient experienced good stretch with both rockerboard and HS stretch. Patient ambulated into clinic today with no AD today and no bandages or steristrips present over incision. Patient's L knee incision moves well and is healed with only very small spots of scabs at inferior and superior most region of the incision. Patient educated that if scabs are knocked off to apply bandaids as to protect the area. Patient completed all exercises well with only minimal multimodal cueing for proper exercise technique. Patient's L knee incision mobilized well in many directions that were assessed. L patella mobility limited minimally into sup/inf directions. AROM of L knee measured as 7-110 deg in supine. Normal modalities response noted following removal of the modalities.    Rehab Potential Excellent   PT Frequency 2x / week   PT Duration 8 weeks   PT Treatment/Interventions ADLs/Self Care Home Management;Cryotherapy;Electrical Stimulation;Therapeutic exercise;Therapeutic activities;Patient/family education;Manual techniques;Vasopneumatic Device;Passive range of motion    PT Next Visit Plan Continue per TKR protocol per MPT POC with modalities PRN for pain/edema.   Consulted and Agree with Plan of Care Patient      Patient will benefit from skilled therapeutic intervention in order to improve the following deficits and impairments:  Pain, Decreased activity tolerance, Decreased strength, Increased edema, Decreased range of motion  Visit Diagnosis: Pain in left knee  Stiffness of left knee, not elsewhere classified  Localized edema     Problem List Patient  Active Problem List   Diagnosis Date Noted  . S/P total knee replacement 09/17/2015    Evelene Croon, PTA 10/16/2015, 10:00 AM  [facility] [address] [place], [place], [address] Phone: [phone]   Fax:  [phone]  Name: Mary Sims MRN: [id number] Date of Birth: [birth year]

## 2015-10-20 ENCOUNTER — Ambulatory Visit: Payer: BLUE CROSS/BLUE SHIELD | Admitting: Physical Therapy

## 2015-10-20 DIAGNOSIS — M25562 Pain in left knee: Secondary | ICD-10-CM

## 2015-10-20 DIAGNOSIS — R600 Localized edema: Secondary | ICD-10-CM | POA: Diagnosis not present

## 2015-10-20 DIAGNOSIS — M25662 Stiffness of left knee, not elsewhere classified: Secondary | ICD-10-CM

## 2015-10-20 NOTE — Therapy (Signed)
[facility] [address] [place], [place], [address] Phone: [phone]   Fax:  [phone]  Physical Therapy Treatment  Patient Details  Name: Mary Sims MRN: [id number] Date of Birth: 06-16-1955 Referring Provider: Mckinley Jewel MD  Encounter Date: 10/20/2015      PT End of Session - 10/20/15 0912    Visit Number 3   Number of Visits 16   Date for PT Re-Evaluation 12/08/15   PT Start Time 0901   PT Stop Time 0950   PT Time Calculation (min) 49 min   Activity Tolerance Patient tolerated treatment well   Behavior During Therapy [facility] for tasks assessed/performed      Past Medical History:  Diagnosis Date  . Anxiety   . Arthritis    "elbows, hands, knees, thumbs" (09/17/2015)  . Chronic bronchitis (HCC)   . Complication of anesthesia    slow to wake up after rhinoplasty  . Frequent UTI    "1-2 times/year; every year" (09/17/2015)  . GERD (gastroesophageal reflux disease)   . PONV (postoperative nausea and vomiting)   . Walking pneumonia 2004    Past Surgical History:  Procedure Laterality Date  . CATARACT EXTRACTION W/ INTRAOCULAR LENS  IMPLANT, BILATERAL Bilateral 2000s  . DILATION AND CURETTAGE OF UTERUS  1999  . JOINT REPLACEMENT    . RHINOPLASTY  2008  . TOTAL KNEE ARTHROPLASTY Left 09/17/2015  . TOTAL KNEE ARTHROPLASTY Left 09/17/2015   Procedure: TOTAL KNEE ARTHROPLASTY;  Surgeon: Loreta Ave, MD;  Location: [facility] OR;  Service: Orthopedics;  Laterality: Left;  . TRIGGER FINGER RELEASE Left ~ 2003   thumb  . TUBAL LIGATION  1981  . VAGINAL HYSTERECTOMY  1999    There were no vitals filed for this visit.      Subjective Assessment - 10/20/15 0920    Subjective Yesterday day morning my knee was very stiff and sore especailly around kneecap.   Pain Score 6    Pain Orientation Left   Pain Descriptors / Indicators Sore   Pain Type Surgical pain   Pain Onset More than a month ago                          [facility] Adult PT Treatment/Exercise - 10/20/15 0001      Knee/Hip Exercises: Aerobic   Nustep Level 4 moving seat forward x 4 to increase range of motion over 17 minutes.     Programme researcher, broadcasting/film/video Location Left knee.   Electrical Stimulation Action IFC   Electrical Stimulation Parameters 1-10 Hz x 15 minutes.   Electrical Stimulation Goals Edema;Pain     Vasopneumatic   Number Minutes Vasopneumatic  15 minutes   Vasopnuematic Location  --  Left knee.   Vasopneumatic Pressure Medium   Vasopneumatic Temperature  --  56 degree.     Manual Therapy   Manual Therapy Soft tissue mobilization   Soft tissue mobilization Left knee STW/M including IASTM and PROM into flexion and extension x 8 minutes.                     PT Long Term Goals - 10/13/15 5284      PT LONG TERM GOAL #1   Title Ind with a HEP.   Time 8   Period Weeks   Status New     PT LONG TERM GOAL #2   Title Active knee flexion to 120-125 degrees+ so the  patient can perform functional tasks and do so with pain not > 2-3/10.   Time 8   Period Weeks   Status New     PT LONG TERM GOAL #3   Title Decrease edema to within 2 cms of non-affected side to assist with pain reduction and range of motion gains.   Time 8   Period Weeks   Status New     PT LONG TERM GOAL #4   Title Perform a reciprocating stair gait with one railing with pain not > 2-3/10.   Time 8   Period Weeks   Status New             Patient will benefit from skilled therapeutic intervention in order to improve the following deficits and impairments:     Visit Diagnosis: Pain in left knee  Stiffness of left knee, not elsewhere classified  Localized edema     Problem List Patient Active Problem List   Diagnosis Date Noted  . S/P total knee replacement 09/17/2015    Mary Sims, [place] 10/20/2015, 10:00 AM  [facility] [address] [place], [place], [address] Phone: [phone]   Fax:  [phone]  Name: Mary Sims MRN: [id number] Date of Birth: 1956/03/28

## 2015-10-22 ENCOUNTER — Ambulatory Visit: Payer: BLUE CROSS/BLUE SHIELD | Admitting: Physical Therapy

## 2015-10-22 DIAGNOSIS — M25562 Pain in left knee: Secondary | ICD-10-CM

## 2015-10-22 DIAGNOSIS — R600 Localized edema: Secondary | ICD-10-CM | POA: Diagnosis not present

## 2015-10-22 DIAGNOSIS — M25662 Stiffness of left knee, not elsewhere classified: Secondary | ICD-10-CM

## 2015-10-22 NOTE — Therapy (Signed)
[facility] [address] [place], [place], [address] Phone: [phone]   Fax:  [phone]  Physical Therapy Treatment  Patient Details  Name: Mary Sims MRN: [id number] Date of Birth: [birth year] Referring Provider: Mckinley Jewel MD  Encounter Date: 10/22/2015    Past Medical History:  Diagnosis Date  . Anxiety   . Arthritis    "elbows, hands, knees, thumbs" (09/17/2015)  . Chronic bronchitis (HCC)   . Complication of anesthesia    slow to wake up after rhinoplasty  . Frequent UTI    "1-2 times/year; every year" (09/17/2015)  . GERD (gastroesophageal reflux disease)   . PONV (postoperative nausea and vomiting)   . Walking pneumonia 2004    Past Surgical History:  Procedure Laterality Date  . CATARACT EXTRACTION W/ INTRAOCULAR LENS  IMPLANT, BILATERAL Bilateral 2000s  . DILATION AND CURETTAGE OF UTERUS  1999  . JOINT REPLACEMENT    . RHINOPLASTY  2008  . TOTAL KNEE ARTHROPLASTY Left 09/17/2015  . TOTAL KNEE ARTHROPLASTY Left 09/17/2015   Procedure: TOTAL KNEE ARTHROPLASTY;  Surgeon: Loreta Ave, MD;  Location: [facility] OR;  Service: Orthopedics;  Laterality: Left;  . TRIGGER FINGER RELEASE Left ~ 2003   thumb  . TUBAL LIGATION  1981  . VAGINAL HYSTERECTOMY  1999    There were no vitals filed for this visit.      Subjective Assessment - 10/22/15 1105    Subjective I'm sore but pleased with my progress.   Patient Stated Goals Get back to normal.   Pain Score 5    Pain Location Knee   Pain Orientation Left   Pain Descriptors / Indicators Sore   Pain Type Surgical pain   Pain Onset More than a month ago      Treatment:  Stationary bike seat 3 x 21 minutes f/b STW/M to anterior knee and quads x 10 minutes f/b IFC and medium vasopneumatic x 15 minutes.  Excellent job.  Progress to seat #2 on bike.                                PT Long Term Goals - 10/13/15 8677      PT LONG TERM GOAL #1   Title Ind with a HEP.   Time 8   Period Weeks   Status New     PT LONG TERM GOAL #2   Title Active knee flexion to 120-125 degrees+ so the patient can perform functional tasks and do so with pain not > 2-3/10.   Time 8   Period Weeks   Status New     PT LONG TERM GOAL #3   Title Decrease edema to within 2 cms of non-affected side to assist with pain reduction and range of motion gains.   Time 8   Period Weeks   Status New     PT LONG TERM GOAL #4   Title Perform a reciprocating stair gait with one railing with pain not > 2-3/10.   Time 8   Period Weeks   Status New             Patient will benefit from skilled therapeutic intervention in order to improve the following deficits and impairments:     Visit Diagnosis: Pain in left knee  Stiffness of left knee, not elsewhere classified  Localized edema     Problem List Patient Active Problem List   Diagnosis Date Noted  .  S/P total knee replacement 09/17/2015    Kincaid Tiger, [place] MPT 10/22/2015, 11:12 AM  [facility] [address] [place], [place], [address] Phone: [phone]   Fax:  [phone]  Name: Mary Sims MRN: [id number] Date of Birth: 10/09/1955

## 2015-10-28 DIAGNOSIS — M1712 Unilateral primary osteoarthritis, left knee: Secondary | ICD-10-CM | POA: Diagnosis not present

## 2015-10-30 ENCOUNTER — Encounter: Payer: Self-pay | Admitting: Physical Therapy

## 2015-10-30 ENCOUNTER — Ambulatory Visit: Payer: BLUE CROSS/BLUE SHIELD | Attending: Orthopedic Surgery | Admitting: Physical Therapy

## 2015-10-30 DIAGNOSIS — M25662 Stiffness of left knee, not elsewhere classified: Secondary | ICD-10-CM | POA: Insufficient documentation

## 2015-10-30 DIAGNOSIS — M25562 Pain in left knee: Secondary | ICD-10-CM | POA: Diagnosis not present

## 2015-10-30 DIAGNOSIS — R600 Localized edema: Secondary | ICD-10-CM | POA: Diagnosis not present

## 2015-10-30 NOTE — Therapy (Signed)
[facility] [address] [place], [place], [address] Phone: [phone]   Fax:  [phone]  Physical Therapy Treatment  Patient Details  Name: Mary Sims MRN: [id number] Date of Birth: May 09, 1955 Referring Provider: Mckinley Jewel MD  Encounter Date: 10/30/2015      PT End of Session - 10/30/15 0926    Visit Number 5   Number of Visits 16   Date for PT Re-Evaluation 12/08/15   PT Start Time 0900   PT Stop Time 0949   PT Time Calculation (min) 49 min   Activity Tolerance Patient tolerated treatment well   Behavior During Therapy [facility] for tasks assessed/performed      Past Medical History:  Diagnosis Date  . Anxiety   . Arthritis    "elbows, hands, knees, thumbs" (09/17/2015)  . Chronic bronchitis (HCC)   . Complication of anesthesia    slow to wake up after rhinoplasty  . Frequent UTI    "1-2 times/year; every year" (09/17/2015)  . GERD (gastroesophageal reflux disease)   . PONV (postoperative nausea and vomiting)   . Walking pneumonia 2004    Past Surgical History:  Procedure Laterality Date  . CATARACT EXTRACTION W/ INTRAOCULAR LENS  IMPLANT, BILATERAL Bilateral 2000s  . DILATION AND CURETTAGE OF UTERUS  1999  . JOINT REPLACEMENT    . RHINOPLASTY  2008  . TOTAL KNEE ARTHROPLASTY Left 09/17/2015  . TOTAL KNEE ARTHROPLASTY Left 09/17/2015   Procedure: TOTAL KNEE ARTHROPLASTY;  Surgeon: Loreta Ave, MD;  Location: [facility] OR;  Service: Orthopedics;  Laterality: Left;  . TRIGGER FINGER RELEASE Left ~ 2003   thumb  . TUBAL LIGATION  1981  . VAGINAL HYSTERECTOMY  1999    There were no vitals filed for this visit.      Subjective Assessment - 10/30/15 0906    Subjective Reports that she has a stitch trying to come out in mid incision. Patient reports she was able to walk around block 1 time and is out of work another 6 weeks per doctor.   Patient Stated Goals Get back to normal.   Currently in Pain? Yes   Pain Score 1     Pain Location Knee   Pain Orientation Left   Pain Descriptors / Indicators Tightness   Pain Type Surgical pain   Pain Onset More than a month ago            [facility] PT Assessment - 10/30/15 0001      Assessment   Medical Diagnosis Left total knee replacement.   Onset Date/Surgical Date 09/17/15   Next MD Visit 12/09/2015     Precautions   Precaution Comments No ultrasound.     Restrictions   Weight Bearing Restrictions No     ROM / Strength   AROM / PROM / Strength AROM     AROM   Overall AROM  Deficits;Within functional limits for tasks performed   AROM Assessment Site Knee   Right/Left Knee Left   Left Knee Extension 0   Left Knee Flexion 110                     [facility] Adult PT Treatment/Exercise - 10/30/15 0001      Knee/Hip Exercises: Aerobic   Stationary Bike L1 x15 min     Knee/Hip Exercises: Machines for Strengthening   Cybex Knee Extension 10# 3x10 reps   Cybex Knee Flexion 20# 3x10 reps     Knee/Hip Exercises: Standing  Hip Flexion Stengthening;Left;2 sets;10 reps;Knee bent  3#   Hip Abduction Stengthening;Left;2 sets;10 reps  3#   Rocker Board 3 minutes     Modalities   Modalities Electrical Stimulation;Vasopneumatic     Programme researcher, broadcasting/film/video Location L knee   Electrical Stimulation Action IFC   Electrical Stimulation Parameters 1-10 hz x15 min   Electrical Stimulation Goals Edema;Pain     Vasopneumatic   Number Minutes Vasopneumatic  15 minutes   Vasopnuematic Location  Knee   Vasopneumatic Pressure Medium   Vasopneumatic Temperature  34                     PT Long Term Goals - 10/30/15 0944      PT LONG TERM GOAL #1   Title Ind with a HEP.   Time 8   Period Weeks   Status New     PT LONG TERM GOAL #2   Title Active knee flexion to 120-125 degrees+ so the patient can perform functional tasks and do so with pain not > 2-3/10.   Time 8   Period Weeks   Status On-going  AROM L  knee 0-110 deg 10/30/2015     PT LONG TERM GOAL #3   Title Decrease edema to within 2 cms of non-affected side to assist with pain reduction and range of motion gains.   Time 8   Period Weeks   Status On-going     PT LONG TERM GOAL #4   Title Perform a reciprocating stair gait with one railing with pain not > 2-3/10.   Time 8   Period Weeks   Status On-going               Plan - 10/30/15 0950    Clinical Impression Statement Patient tolerated today's treatment well and was able to tolerate machine knee strengthening well with no reports of discomfort. Patient able to tolerate standing L hip strengthening well with 3# with no complaints as well. AROM L knee measured as 0-110 deg in supine. Normal modalities response noted following removal of the modalities.    Rehab Potential Excellent   PT Frequency 2x / week   PT Duration 8 weeks   PT Treatment/Interventions ADLs/Self Care Home Management;Cryotherapy;Electrical Stimulation;Therapeutic exercise;Therapeutic activities;Patient/family education;Manual techniques;Vasopneumatic Device;Passive range of motion   PT Next Visit Plan Continue per TKR protocol per MPT POC with modalities PRN for pain/edema.   Consulted and Agree with Plan of Care Patient      Patient will benefit from skilled therapeutic intervention in order to improve the following deficits and impairments:  Pain, Decreased activity tolerance, Decreased strength, Increased edema, Decreased range of motion  Visit Diagnosis: Pain in left knee  Stiffness of left knee, not elsewhere classified  Localized edema     Problem List Patient Active Problem List   Diagnosis Date Noted  . S/P total knee replacement 09/17/2015    Evelene Croon, PTA 10/30/2015, 9:53 AM  [facility] [address] [place], [place], [address] Phone: [phone]   Fax:  [phone]  Name: Mary Sims MRN: [id number] Date of Birth:  [birth year]

## 2015-10-31 ENCOUNTER — Ambulatory Visit: Payer: BLUE CROSS/BLUE SHIELD | Admitting: Physical Therapy

## 2015-10-31 DIAGNOSIS — R600 Localized edema: Secondary | ICD-10-CM

## 2015-10-31 DIAGNOSIS — M25562 Pain in left knee: Secondary | ICD-10-CM | POA: Diagnosis not present

## 2015-10-31 DIAGNOSIS — M25662 Stiffness of left knee, not elsewhere classified: Secondary | ICD-10-CM | POA: Diagnosis not present

## 2015-10-31 NOTE — Therapy (Signed)
[facility] [address] [place], [place], [address] Phone: [phone]   Fax:  [phone]  Physical Therapy Treatment  Patient Details  Name: Mary Sims MRN: [id number] Date of Birth: 10/09/1955 Referring Provider: Mckinley Jewel MD  Encounter Date: 10/31/2015      PT End of Session - 10/31/15 1114    Visit Number 6   Number of Visits 16   Date for PT Re-Evaluation 12/08/15   PT Start Time 0945   PT Stop Time 1047   PT Time Calculation (min) 62 min   Activity Tolerance Patient tolerated treatment well   Behavior During Therapy [facility] for tasks assessed/performed      Past Medical History:  Diagnosis Date  . Anxiety   . Arthritis    "elbows, hands, knees, thumbs" (09/17/2015)  . Chronic bronchitis (HCC)   . Complication of anesthesia    slow to wake up after rhinoplasty  . Frequent UTI    "1-2 times/year; every year" (09/17/2015)  . GERD (gastroesophageal reflux disease)   . PONV (postoperative nausea and vomiting)   . Walking pneumonia 2004    Past Surgical History:  Procedure Laterality Date  . CATARACT EXTRACTION W/ INTRAOCULAR LENS  IMPLANT, BILATERAL Bilateral 2000s  . DILATION AND CURETTAGE OF UTERUS  1999  . JOINT REPLACEMENT    . RHINOPLASTY  2008  . TOTAL KNEE ARTHROPLASTY Left 09/17/2015  . TOTAL KNEE ARTHROPLASTY Left 09/17/2015   Procedure: TOTAL KNEE ARTHROPLASTY;  Surgeon: Loreta Ave, MD;  Location: [facility] OR;  Service: Orthopedics;  Laterality: Left;  . TRIGGER FINGER RELEASE Left ~ 2003   thumb  . TUBAL LIGATION  1981  . VAGINAL HYSTERECTOMY  1999    There were no vitals filed for this visit.      Subjective Assessment - 10/31/15 1115    Subjective I'm pleased with my progress.  I'm going to the beach.   Patient Stated Goals Get back to normal.   Pain Score 1    Pain Location Knee   Pain Orientation Left   Pain Descriptors / Indicators Tightness   Pain Type Surgical pain   Pain Onset More than  a month ago     Treatment:  Stationary bike x 15 minutes f/b knee extension with 10# x 6 minutes f/b ham curls 30# x 6 minutes f/b Leg press with 2 plates x 5 minutes f/b instruct in seated flexion using right LE and prone sheet stretch f/b PROM into left knee flexion x 5 minutes.  Achieved passive left knee flexion to 115 degrees today.  Medium vasopneumatic a IFC at 1-10 Hz x 15 minutes.  Excellent job.                                 PT Long Term Goals - 10/30/15 0944      PT LONG TERM GOAL #1   Title Ind with a HEP.   Time 8   Period Weeks   Status New     PT LONG TERM GOAL #2   Title Active knee flexion to 120-125 degrees+ so the patient can perform functional tasks and do so with pain not > 2-3/10.   Time 8   Period Weeks   Status On-going  AROM L knee 0-110 deg 10/30/2015     PT LONG TERM GOAL #3   Title Decrease edema to within 2 cms of non-affected side to assist with  pain reduction and range of motion gains.   Time 8   Period Weeks   Status On-going     PT LONG TERM GOAL #4   Title Perform a reciprocating stair gait with one railing with pain not > 2-3/10.   Time 8   Period Weeks   Status On-going               Plan - 10/30/15 0950    Clinical Impression Statement Patient tolerated today's treatment well and was able to tolerate machine knee strengthening well with no reports of discomfort. Patient able to tolerate standing L hip strengthening well with 3# with no complaints as well. AROM L knee measured as 0-110 deg in supine. Normal modalities response noted following removal of the modalities.    Rehab Potential Excellent   PT Frequency 2x / week   PT Duration 8 weeks   PT Treatment/Interventions ADLs/Self Care Home Management;Cryotherapy;Electrical Stimulation;Therapeutic exercise;Therapeutic activities;Patient/family education;Manual techniques;Vasopneumatic Device;Passive range of motion   PT Next Visit Plan Continue per TKR  protocol per MPT POC with modalities PRN for pain/edema.   Consulted and Agree with Plan of Care Patient      Patient will benefit from skilled therapeutic intervention in order to improve the following deficits and impairments:     Visit Diagnosis: Pain in left knee  Stiffness of left knee, not elsewhere classified  Localized edema     Problem List Patient Active Problem List   Diagnosis Date Noted  . S/P total knee replacement 09/17/2015    Chritopher Coster, [place] MPT 10/31/2015, 12:12 PM  [facility] [address] [place], [place], [address] Phone: [phone]   Fax:  [phone]  Name: Mary Sims MRN: [id number] Date of Birth: 01-14-1956

## 2015-11-10 ENCOUNTER — Ambulatory Visit: Payer: BLUE CROSS/BLUE SHIELD | Admitting: Physical Therapy

## 2015-11-10 ENCOUNTER — Encounter: Payer: Self-pay | Admitting: Physical Therapy

## 2015-11-10 DIAGNOSIS — R600 Localized edema: Secondary | ICD-10-CM | POA: Diagnosis not present

## 2015-11-10 DIAGNOSIS — M25662 Stiffness of left knee, not elsewhere classified: Secondary | ICD-10-CM

## 2015-11-10 DIAGNOSIS — M25562 Pain in left knee: Secondary | ICD-10-CM

## 2015-11-10 NOTE — Therapy (Signed)
[facility] [address] [place], [place], [address] Phone: [phone]   Fax:  [phone]  Physical Therapy Treatment  Patient Details  Name: Mary Sims MRN: [id number] Date of Birth: 22-Jul-1955 Referring Provider: Mckinley Jewelaniel Murphy MD  Encounter Date: 11/10/2015      PT End of Session - 11/10/15 0908    Visit Number 7   Number of Visits 16   Date for PT Re-Evaluation 12/08/15   PT Start Time 0905   PT Stop Time 0956   PT Time Calculation (min) 51 min   Activity Tolerance Patient tolerated treatment well   Behavior During Therapy [facility] for tasks assessed/performed      Past Medical History:  Diagnosis Date  . Anxiety   . Arthritis    "elbows, hands, knees, thumbs" (09/17/2015)  . Chronic bronchitis (HCC)   . Complication of anesthesia    slow to wake up after rhinoplasty  . Frequent UTI    "1-2 times/year; every year" (09/17/2015)  . GERD (gastroesophageal reflux disease)   . PONV (postoperative nausea and vomiting)   . Walking pneumonia 2004    Past Surgical History:  Procedure Laterality Date  . CATARACT EXTRACTION W/ INTRAOCULAR LENS  IMPLANT, BILATERAL Bilateral 2000s  . DILATION AND CURETTAGE OF UTERUS  1999  . JOINT REPLACEMENT    . RHINOPLASTY  2008  . TOTAL KNEE ARTHROPLASTY Left 09/17/2015  . TOTAL KNEE ARTHROPLASTY Left 09/17/2015   Procedure: TOTAL KNEE ARTHROPLASTY;  Surgeon: Loreta Aveaniel F Murphy, MD;  [facility];  Service: Orthopedics;  Laterality: Left;  . TRIGGER FINGER RELEASE Left ~ 2003   thumb  . TUBAL LIGATION  1981  . VAGINAL HYSTERECTOMY  1999    There were no vitals filed for this visit.      Subjective Assessment - 11/10/15 0904    Subjective Reports that she has had a lot of swelling since being at the beach last week. Reports swelling down to L ankle and the top of the L foot. Reports she was on her feet and walking a lot on vacation. Reports that she has pain at night at this time.    Patient Stated Goals Get back to normal.   Currently in Pain? No/denies            [facility] - 11/10/15 0001      Assessment   Medical Diagnosis Left total knee replacement.   Onset Date/Surgical Date 09/17/15   Next MD Visit 12/09/2015     Precautions   Precaution Comments No ultrasound.     Restrictions   Weight Bearing Restrictions No     ROM / Strength   AROM / PROM / Strength AROM     AROM   Overall AROM  Deficits   AROM Assessment Site Knee   Right/Left Knee Left   Left Knee Flexion 112                     [facility] Adult PT Treatment/Exercise - 11/10/15 0001      Knee/Hip Exercises: Aerobic   Stationary Bike Seat 4 x15 min     Knee/Hip Exercises: Machines for Strengthening   Cybex Knee Extension 10# 3x10 reps   Cybex Knee Flexion 30# 3x10 reps   Cybex Leg Press Seat 6, 2 plates, 6N624x10 reps     Knee/Hip Exercises: Standing   Forward Lunges Left;2 sets;10 reps;3 seconds     Modalities   Modalities Electrical Stimulation;Vasopneumatic  Programme researcher, broadcasting/film/videolectrical Stimulation   Electrical Stimulation Location L knee   Engineer, manufacturinglectrical Stimulation Action IFC   Electrical Stimulation Parameters 80-150 hz x15 min   Electrical Stimulation Goals Edema;Pain     Vasopneumatic   Number Minutes Vasopneumatic  15 minutes   Vasopnuematic Location  Knee   Vasopneumatic Pressure Medium   Vasopneumatic Temperature  34                PT Education - 11/10/15 0957    Education provided Yes   Education Details HEP- ankle pumps, lunges   Person(s) Educated Patient   Methods Explanation;Verbal cues;Handout   Comprehension Verbalized understanding;Verbal cues required             PT Long Term Goals - 11/10/15 0950      PT LONG TERM GOAL #1   Title Ind with a HEP.   Time 8   Period Weeks   Status New     PT LONG TERM GOAL #2   Title Active knee flexion to 120-125 degrees+ so the patient can perform functional tasks and do so with pain not >  2-3/10.   Time 8   Period Weeks   Status On-going  AROM L knee flexion 112 deg 11/10/2015     PT LONG TERM GOAL #3   Title Decrease edema to within 2 cms of non-affected side to assist with pain reduction and range of motion gains.   Time 8   Period Weeks   Status On-going     PT LONG TERM GOAL #4   Title Perform a reciprocating stair gait with one railing with pain not > 2-3/10.   Time 8   Period Weeks   Status On-going               Plan - 11/10/15 0943    Clinical Impression Statement Patient tolerated today's treatment fairly well although she arrived with increased inflammation and swelling. Patient had no complaints during any of the exercises today. Increased inflammation was noted around patient's L ankle today. Normal modalities response noted following removal of the modalities. AROM L knee flexion measured as 112 deg today in supine. Patient accepted HEP for edema control and lunges to improve ROM into flexion with education for parameters and technique.   Rehab Potential Excellent   PT Frequency 2x / week   PT Duration 8 weeks   PT Treatment/Interventions ADLs/Self Care Home Management;Cryotherapy;Electrical Stimulation;Therapeutic exercise;Therapeutic activities;Patient/family education;Manual techniques;Vasopneumatic Device;Passive range of motion   PT Next Visit Plan Continue per TKR protocol per MPT POC with modalities PRN for pain/edema.   Consulted and Agree with Plan of Care Patient      Patient will benefit from skilled therapeutic intervention in order to improve the following deficits and impairments:  Pain, Decreased activity tolerance, Decreased strength, Increased edema, Decreased range of motion  Visit Diagnosis: Pain in left knee  Stiffness of left knee, not elsewhere classified  Localized edema     Problem List Patient Active Problem List   Diagnosis Date Noted  . S/P total knee replacement 09/17/2015    Evelene CroonKelsey M Parsons,  PTA 11/10/2015, 10:00 AM  [facility] [address] [place], [place], [address] Phone: [phone]   Fax:  [phone]  Name: Mary Sims MRN: [id number] Date of Birth: 1955-12-20

## 2015-11-10 NOTE — Patient Instructions (Addendum)
ROM: Plantar / Dorsiflexion    With left leg relaxed, gently flex and extend ankle. Move through full range of motion. Avoid pain. Complete while elevating while watching TV for several minutes if tolerable to control edema.  http://orth.exer.us/34   Copyright  VHI. All rights reserved.  Forward Lunge    Standing with feet shoulder width apart and stomach tight, step forward with left leg. Repeat __10__ times per set. Do _2___ sets per session. Do __3-4__ sessions per day.  http://orth.exer.us/1146   Copyright  VHI. All rights reserved.

## 2015-11-12 ENCOUNTER — Ambulatory Visit: Payer: BLUE CROSS/BLUE SHIELD | Admitting: Physical Therapy

## 2015-11-12 ENCOUNTER — Encounter: Payer: Self-pay | Admitting: Physical Therapy

## 2015-11-12 DIAGNOSIS — M25662 Stiffness of left knee, not elsewhere classified: Secondary | ICD-10-CM

## 2015-11-12 DIAGNOSIS — M25562 Pain in left knee: Secondary | ICD-10-CM

## 2015-11-12 DIAGNOSIS — R600 Localized edema: Secondary | ICD-10-CM | POA: Diagnosis not present

## 2015-11-12 NOTE — Therapy (Signed)
[facility] [address] [place], [place], [address] Phone: [phone]   Fax:  [phone]  Physical Therapy Treatment  Patient Details  Name: Mary Sims MRN: [id number] Date of Birth: July 17, 1955 Referring Provider: Mckinley Jewelaniel Murphy MD  Encounter Date: 11/12/2015      PT End of Session - 11/12/15 0905    Visit Number 8   Number of Visits 16   Date for PT Re-Evaluation 12/08/15   PT Start Time 0902   PT Stop Time 0952   PT Time Calculation (min) 50 min   Activity Tolerance Patient tolerated treatment well   Behavior During Therapy [facility] for tasks assessed/performed      Past Medical History:  Diagnosis Date  . Anxiety   . Arthritis    "elbows, hands, knees, thumbs" (09/17/2015)  . Chronic bronchitis (HCC)   . Complication of anesthesia    slow to wake up after rhinoplasty  . Frequent UTI    "1-2 times/year; every year" (09/17/2015)  . GERD (gastroesophageal reflux disease)   . PONV (postoperative nausea and vomiting)   . Walking pneumonia 2004    Past Surgical History:  Procedure Laterality Date  . CATARACT EXTRACTION W/ INTRAOCULAR LENS  IMPLANT, BILATERAL Bilateral 2000s  . DILATION AND CURETTAGE OF UTERUS  1999  . JOINT REPLACEMENT    . RHINOPLASTY  2008  . TOTAL KNEE ARTHROPLASTY Left 09/17/2015  . TOTAL KNEE ARTHROPLASTY Left 09/17/2015   Procedure: TOTAL KNEE ARTHROPLASTY;  Surgeon: Loreta Aveaniel F Murphy, MD;  [facility];  Service: Orthopedics;  Laterality: Left;  . TRIGGER FINGER RELEASE Left ~ 2003   thumb  . TUBAL LIGATION  1981  . VAGINAL HYSTERECTOMY  1999    There were no vitals filed for this visit.      Subjective Assessment - 11/12/15 0903    Subjective Reports that she has had a stressful few days and has noted that she has felt a burning, stinging in medial L knee. Reports that she has discomfort with sitting in a car and feeling the vibration from the car.   Patient Stated Goals Get back to normal.    Currently in Pain? No/denies            [facility] - 11/12/15 0001      Assessment   Medical Diagnosis Left total knee replacement.   Onset Date/Surgical Date 09/17/15   Next MD Visit 12/09/2015     Precautions   Precaution Comments No ultrasound.     Restrictions   Weight Bearing Restrictions No                     [facility] Adult PT Treatment/Exercise - 11/12/15 0001      Knee/Hip Exercises: Aerobic   Stationary Bike Seat 7 x10 min     Knee/Hip Exercises: Machines for Strengthening   Cybex Knee Extension 10# 3x10 reps   Cybex Knee Flexion 30# 3x10 reps   Cybex Leg Press Seat 6, 2 plates, 5G383x10 reps     Knee/Hip Exercises: Standing   Hip Flexion Stengthening;Left;2 sets;10 reps;Knee bent   Hip Flexion Limitations 4#   Forward Lunges Left;3 seconds;3 sets;10 reps   Hip Abduction Stengthening;Left;2 sets;10 reps   Abduction Limitations 4#     Modalities   Modalities Electrical Stimulation;Vasopneumatic     Electrical Stimulation   Electrical Stimulation Location L knee   Electrical Stimulation Action IFC   Electrical Stimulation Parameters 1-10 hz x15 min   Electrical Stimulation  Goals Edema;Pain     Vasopneumatic   Number Minutes Vasopneumatic  15 minutes   Vasopnuematic Location  Knee   Vasopneumatic Pressure Medium   Vasopneumatic Temperature  34                     PT Long Term Goals - 11/12/15 0950      PT LONG TERM GOAL #1   Title Ind with a HEP.   Time 8   Period Weeks   Status Achieved     PT LONG TERM GOAL #2   Title Active knee flexion to 120-125 degrees+ so the patient can perform functional tasks and do so with pain not > 2-3/10.   Time 8   Period Weeks   Status On-going  AROM L knee flexion 112 deg 11/10/2015     PT LONG TERM GOAL #3   Title Decrease edema to within 2 cms of non-affected side to assist with pain reduction and range of motion gains.   Time 8   Period Weeks   Status On-going     PT  LONG TERM GOAL #4   Title Perform a reciprocating stair gait with one railing with pain not > 2-3/10.   Time 8   Period Weeks   Status On-going               Plan - 11/12/15 [id number]    Clinical Impression Statement Patient tolerated today's treatment well with no reports of increased pain. Patient able to tolerate resisted L hip strengthening exercises well. Patient reported being able to lunge farther today per patient report. Normal modalities response noted following removal of the modalities.   Rehab Potential Excellent   PT Frequency 2x / week   PT Duration 8 weeks   PT Treatment/Interventions ADLs/Self Care Home Management;Cryotherapy;Electrical Stimulation;Therapeutic exercise;Therapeutic activities;Patient/family education;Manual techniques;Vasopneumatic Device;Passive range of motion   PT Next Visit Plan Continue per TKR protocol per MPT POC with modalities PRN for pain/edema.   Consulted and Agree with Plan of Care Patient      Patient will benefit from skilled therapeutic intervention in order to improve the following deficits and impairments:  Pain, Decreased activity tolerance, Decreased strength, Increased edema, Decreased range of motion  Visit Diagnosis: Pain in left knee  Stiffness of left knee, not elsewhere classified  Localized edema     Problem List Patient Active Problem List   Diagnosis Date Noted  . S/P total knee replacement 09/17/2015    Evelene CroonKelsey M Parsons, PTA 11/12/2015, 9:55 AM  [facility] [address] [place], [place], [address] Phone: [phone]   Fax:  [phone]  Name: Mary Seamela Finkler MRN: [id number] Date of Birth: [birth year]

## 2015-11-14 ENCOUNTER — Encounter: Payer: BLUE CROSS/BLUE SHIELD | Admitting: Physical Therapy

## 2015-11-18 ENCOUNTER — Ambulatory Visit: Payer: BLUE CROSS/BLUE SHIELD | Admitting: Physical Therapy

## 2015-11-18 DIAGNOSIS — R600 Localized edema: Secondary | ICD-10-CM

## 2015-11-18 DIAGNOSIS — M25562 Pain in left knee: Secondary | ICD-10-CM

## 2015-11-18 DIAGNOSIS — M25662 Stiffness of left knee, not elsewhere classified: Secondary | ICD-10-CM

## 2015-11-18 NOTE — Therapy (Signed)
[facility] [address] [place], [place], [address] Phone: [phone]   Fax:  [phone]  Physical Therapy Treatment  Patient Details  Name: Mary Sims MRN: [id number] Date of Birth: Aug 01, 1955 Referring Provider: Mckinley Jewelaniel Murphy MD  Encounter Date: 11/18/2015      PT End of Session - 11/18/15 1118    Visit Number 9   Number of Visits 16   Date for PT Re-Evaluation 12/08/15   PT Start Time 1117   PT Stop Time 1213   PT Time Calculation (min) 56 min   Activity Tolerance Patient tolerated treatment well   Behavior During Therapy [facility] for tasks assessed/performed      Past Medical History:  Diagnosis Date  . Anxiety   . Arthritis    "elbows, hands, knees, thumbs" (09/17/2015)  . Chronic bronchitis (HCC)   . Complication of anesthesia    slow to wake up after rhinoplasty  . Frequent UTI    "1-2 times/year; every year" (09/17/2015)  . GERD (gastroesophageal reflux disease)   . PONV (postoperative nausea and vomiting)   . Walking pneumonia 2004    Past Surgical History:  Procedure Laterality Date  . CATARACT EXTRACTION W/ INTRAOCULAR LENS  IMPLANT, BILATERAL Bilateral 2000s  . DILATION AND CURETTAGE OF UTERUS  1999  . JOINT REPLACEMENT    . RHINOPLASTY  2008  . TOTAL KNEE ARTHROPLASTY Left 09/17/2015  . TOTAL KNEE ARTHROPLASTY Left 09/17/2015   Procedure: TOTAL KNEE ARTHROPLASTY;  Surgeon: Loreta Aveaniel F Murphy, MD;  [facility];  Service: Orthopedics;  Laterality: Left;  . TRIGGER FINGER RELEASE Left ~ 2003   thumb  . TUBAL LIGATION  1981  . VAGINAL HYSTERECTOMY  1999    There were no vitals filed for this visit.      Subjective Assessment - 11/18/15 1118    Subjective Patient reports no new complaints.    Patient Stated Goals Get back to normal.   Currently in Pain? Yes   Pain Score 1    Pain Location Knee   Pain Orientation Left   Pain Descriptors / Indicators Tightness   Pain Type Surgical pain   Pain Onset  More than a month ago   Pain Frequency Constant   Aggravating Factors  Riding in car or sleeping            [facility] PT Assessment - 11/18/15 0001      AROM   Left Knee Flexion 112  passive 116                     [facility] Adult PT Treatment/Exercise - 11/18/15 0001      Knee/Hip Exercises: Aerobic   Recumbent Bike x 12 min adjusting seat forward for increased stretch     Knee/Hip Exercises: Machines for Strengthening   Cybex Knee Extension 10# 3x10 reps   Cybex Knee Flexion 30# 3x10 reps   Cybex Leg Press Seat 6, 2 plates, 6N623x10 reps     Knee/Hip Exercises: Standing   Hip Flexion Stengthening;Left;2 sets;15 reps   Hip Flexion Limitations 4#   Hip Abduction Stengthening;Both;2 sets;15 reps   Abduction Limitations 4#   Step Down Step Height: 2";Step Height: 6";1 set;5 reps  eccentric quad     Modalities   Modalities Electrical Stimulation;Vasopneumatic     Electrical Stimulation   Electrical Stimulation Location L knee   Electrical Stimulation Action IFC   Electrical Stimulation Parameters 1-10 Hz to tolerance x 15 min   Electrical Stimulation Goals  Edema;Pain     Vasopneumatic   Number Minutes Vasopneumatic  15 minutes   Vasopnuematic Location  Knee   Vasopneumatic Pressure Medium   Vasopneumatic Temperature  34                     PT Long Term Goals - 11/18/15 1138      PT LONG TERM GOAL #1   Title Ind with a HEP.   Time 8   Period Weeks   Status Achieved     PT LONG TERM GOAL #2   Title Active knee flexion to 120-125 degrees+ so the patient can perform functional tasks and do so with pain not > 2-3/10.   Time 8   Period Weeks   Status On-going     PT LONG TERM GOAL #3   Title Decrease edema to within 2 cms of non-affected side to assist with pain reduction and range of motion gains.   Time 8   Period Weeks   Status On-going     PT LONG TERM GOAL #4   Title Perform a reciprocating stair gait with one railing with pain not >  2-3/10.   Time 8   Period Weeks   Status Achieved               Plan - 11/18/15 1201    Clinical Impression Statement Patient is progressing well with goals meetiing her LTG for stair climbing. She demo's some eccentric weakness at end range during descending and was given exercise for HEP. Flexion is still limited primarily by edema. Remaining goals are ongoing.   Rehab Potential Excellent   PT Frequency 2x / week   PT Duration 8 weeks   PT Treatment/Interventions ADLs/Self Care Home Management;Cryotherapy;Electrical Stimulation;Therapeutic exercise;Therapeutic activities;Patient/family education;Manual techniques;Vasopneumatic Device;Passive range of motion   PT Next Visit Plan Continue per TKR protocol per MPT POC with modalities PRN for pain/edema.   PT Home Exercise Plan heel tap   Consulted and Agree with Plan of Care Patient      Patient will benefit from skilled therapeutic intervention in order to improve the following deficits and impairments:  Pain, Decreased activity tolerance, Decreased strength, Increased edema, Decreased range of motion  Visit Diagnosis: Stiffness of left knee, not elsewhere classified  Pain in left knee  Localized edema     Problem List Patient Active Problem List   Diagnosis Date Noted  . S/P total knee replacement 09/17/2015    Solon PalmJulie Reiana Poteet PT 11/18/2015, 12:06 PM  [facility] [address] [place], [place], [address] Phone: [phone]   Fax:  [phone]  Name: Mary Seamela Basden MRN: [id number] Date of Birth: April 30, 1955

## 2015-11-20 ENCOUNTER — Ambulatory Visit: Payer: BLUE CROSS/BLUE SHIELD | Admitting: Physical Therapy

## 2015-11-20 DIAGNOSIS — R600 Localized edema: Secondary | ICD-10-CM

## 2015-11-20 DIAGNOSIS — M25562 Pain in left knee: Secondary | ICD-10-CM | POA: Diagnosis not present

## 2015-11-20 DIAGNOSIS — M25662 Stiffness of left knee, not elsewhere classified: Secondary | ICD-10-CM

## 2015-11-20 NOTE — Therapy (Signed)
[facility] [address] [place], [place], [address] Phone: [phone]   Fax:  [phone]  Physical Therapy Treatment  Patient Details  Name: Mary Sims MRN: [id number] Date of Birth: 1956-03-11 Referring Provider: Mckinley Jewel MD  Encounter Date: 11/20/2015      PT End of Session - 11/20/15 0916    Visit Number 10   Number of Visits 16   Date for PT Re-Evaluation 12/08/15   PT Start Time 0903   PT Stop Time 1000   PT Time Calculation (min) 57 min   Activity Tolerance Patient tolerated treatment well   Behavior During Therapy [facility] for tasks assessed/performed      Past Medical History:  Diagnosis Date  . Anxiety   . Arthritis    "elbows, hands, knees, thumbs" (09/17/2015)  . Chronic bronchitis (HCC)   . Complication of anesthesia    slow to wake up after rhinoplasty  . Frequent UTI    "1-2 times/year; every year" (09/17/2015)  . GERD (gastroesophageal reflux disease)   . PONV (postoperative nausea and vomiting)   . Walking pneumonia 2004    Past Surgical History:  Procedure Laterality Date  . CATARACT EXTRACTION W/ INTRAOCULAR LENS  IMPLANT, BILATERAL Bilateral 2000s  . DILATION AND CURETTAGE OF UTERUS  1999  . JOINT REPLACEMENT    . RHINOPLASTY  2008  . TOTAL KNEE ARTHROPLASTY Left 09/17/2015  . TOTAL KNEE ARTHROPLASTY Left 09/17/2015   Procedure: TOTAL KNEE ARTHROPLASTY;  Surgeon: Loreta Ave, MD;  Location: [facility] OR;  Service: Orthopedics;  Laterality: Left;  . TRIGGER FINGER RELEASE Left ~ 2003   thumb  . TUBAL LIGATION  1981  . VAGINAL HYSTERECTOMY  1999    There were no vitals filed for this visit.      Subjective Assessment - 11/20/15 0916    Subjective I'm ready to go back to work.   Pain Score 2    Pain Location Knee   Pain Orientation Left   Pain Descriptors / Indicators Tightness   Pain Type Surgical pain   Pain Onset More than a month ago                         [facility]  Adult PT Treatment/Exercise - 11/20/15 0001      Knee/Hip Exercises: Aerobic   Stationary Bike 20 minutes.     Vasopneumatic   Vasopnuematic Location  --  Left knee.   Vasopneumatic Pressure Medium     Manual Therapy   Soft tissue mobilization STW/M to right quadriceps and PROM into flexion x 18 minutes.                     PT Long Term Goals - 11/18/15 1138      PT LONG TERM GOAL #1   Title Ind with a HEP.   Time 8   Period Weeks   Status Achieved     PT LONG TERM GOAL #2   Title Active knee flexion to 120-125 degrees+ so the patient can perform functional tasks and do so with pain not > 2-3/10.   Time 8   Period Weeks   Status On-going     PT LONG TERM GOAL #3   Title Decrease edema to within 2 cms of non-affected side to assist with pain reduction and range of motion gains.   Time 8   Period Weeks   Status On-going     PT LONG TERM  GOAL #4   Title Perform a reciprocating stair gait with one railing with pain not > 2-3/10.   Time 8   Period Weeks   Status Achieved             Patient will benefit from skilled therapeutic intervention in order to improve the following deficits and impairments:  Pain, Decreased activity tolerance, Decreased strength, Increased edema, Decreased range of motion  Visit Diagnosis: Stiffness of left knee, not elsewhere classified  Pain in left knee  Localized edema     Problem List Patient Active Problem List   Diagnosis Date Noted  . S/P total knee replacement 09/17/2015    Alease Fait, [place] 11/20/2015, 10:35 AM  [facility] [address] [place], [place], [address] Phone: [phone]   Fax:  [phone]  Name: Mary Seamela Sims MRN: [id number] Date of Birth: Oct 24, 1955

## 2015-11-24 ENCOUNTER — Encounter: Payer: Self-pay | Admitting: Physical Therapy

## 2015-11-24 ENCOUNTER — Ambulatory Visit: Payer: BLUE CROSS/BLUE SHIELD | Admitting: Physical Therapy

## 2015-11-24 DIAGNOSIS — M25562 Pain in left knee: Secondary | ICD-10-CM

## 2015-11-24 DIAGNOSIS — M25662 Stiffness of left knee, not elsewhere classified: Secondary | ICD-10-CM

## 2015-11-24 DIAGNOSIS — R600 Localized edema: Secondary | ICD-10-CM

## 2015-11-24 NOTE — Therapy (Signed)
[facility] [place], [place], [address] Phone: [phone]   Fax:  [phone]  Physical Therapy Treatment  Patient Details  Name: Mary Sims MRN: [id number] Date of Birth: 04-03-1955 Referring Provider: Kathryne Hitch MD  Encounter Date: 11/24/2015      PT End of Session - 11/24/15 0933    Visit Number 11   Number of Visits 16   Date for PT Re-Evaluation 12/08/15   PT Start Time 0900   PT Stop Time 0958   PT Time Calculation (min) 58 min   Activity Tolerance Patient tolerated treatment well   Behavior During Therapy [facility] for tasks assessed/performed      Past Medical History:  Diagnosis Date  . Anxiety   . Arthritis    "elbows, hands, knees, thumbs" (09/17/2015)  . Chronic bronchitis ([place])   . Complication of anesthesia    slow to wake up after rhinoplasty  . Frequent UTI    "1-2 times/year; every year" (09/17/2015)  . GERD (gastroesophageal reflux disease)   . PONV (postoperative nausea and vomiting)   . Walking pneumonia 2004    Past Surgical History:  Procedure Laterality Date  . CATARACT EXTRACTION W/ INTRAOCULAR LENS  IMPLANT, BILATERAL Bilateral 2000s  . DILATION AND CURETTAGE OF UTERUS  1999  . JOINT REPLACEMENT    . RHINOPLASTY  2008  . TOTAL KNEE ARTHROPLASTY Left 09/17/2015  . TOTAL KNEE ARTHROPLASTY Left 09/17/2015   Procedure: TOTAL KNEE ARTHROPLASTY;  Surgeon: Ninetta Lights, MD;  Location: [place];  Service: Orthopedics;  Laterality: Left;  . TRIGGER FINGER RELEASE Left ~ 2003   thumb  . TUBAL LIGATION  1981  . VAGINAL HYSTERECTOMY  1999    There were no vitals filed for this visit.      Subjective Assessment - 11/24/15 0908    Subjective Patient reported improvement overall and and more soreness at night or with prolong standing.   Patient Stated Goals Get back to normal.   Currently in Pain? Yes   Pain Score 2    Pain Location Knee   Pain Orientation Left   Pain Descriptors /  Indicators Tightness   Pain Type Surgical pain   Pain Onset More than a month ago   Pain Frequency Constant   Aggravating Factors  at night or prolong activity            [facility] PT Assessment - 11/24/15 0001      ROM / Strength   AROM / PROM / Strength AROM;PROM     AROM   AROM Assessment Site Knee   Right/Left Knee Left   Left Knee Flexion 105     PROM   PROM Assessment Site Knee   Right/Left Knee Left   Left Knee Flexion 117                     [facility] Adult PT Treatment/Exercise - 11/24/15 0001      Knee/Hip Exercises: Aerobic   Nustep L4x62mn      Knee/Hip Exercises: Standing   Lateral Step Up Left;3 sets;10 reps;Step Height: 6"   Forward Step Up Left;3 sets;10 reps;Step Height: 6"   Step Down Left;2 sets;10 reps;Step Height: 6"  and x5     Manual Therapy   Manual Therapy Passive ROM   Passive ROM manual PROM for left knee flexion with low load holds  PT Long Term Goals - 11/24/15 0934      PT LONG TERM GOAL #1   Title Ind with a HEP.   Time 8   Period Weeks   Status Achieved     PT LONG TERM GOAL #2   Title Active knee flexion to 120-125 degrees+ so the patient can perform functional tasks and do so with pain not > 2-3/10.   Time 8   Period Weeks   Status On-going  AROM 105 degrees 11/24/15     PT LONG TERM GOAL #3   Title Decrease edema to within 2 cms of non-affected side to assist with pain reduction and range of motion gains.   Time 8   Period Weeks   Status Achieved  1cm 11/24/15     PT LONG TERM GOAL #4   Title Perform a reciprocating stair gait with one railing with pain not > 2-3/10.   Time 8   Period Weeks   Status Achieved               Plan - 11/24/15 0939    Clinical Impression Statement Patient is progressing with all activities today. Patient tolerated treatment today and has improved ROM for left knee flexion and decreased edema to 1 cm. Patient has reported a lot of stairs  at home and has some difficulty with decending. Patient met LTG# 3 today and remaining goal ongoing due to full ROM for flexion deficit.    Rehab Potential Excellent   PT Frequency 2x / week   PT Duration 8 weeks   PT Treatment/Interventions ADLs/Self Care Home Management;Cryotherapy;Electrical Stimulation;Therapeutic exercise;Therapeutic activities;Patient/family education;Manual techniques;Vasopneumatic Device;Passive range of motion   PT Next Visit Plan Continue per TKR protocol per MPT POC with modalities PRN for pain/edema. (MD. Percell Miller 12/09/15)   Consulted and Agree with Plan of Care Patient      Patient will benefit from skilled therapeutic intervention in order to improve the following deficits and impairments:  Pain, Decreased activity tolerance, Decreased strength, Increased edema, Decreased range of motion  Visit Diagnosis: Stiffness of left knee, not elsewhere classified  Pain in left knee  Localized edema     Problem List Patient Active Problem List   Diagnosis Date Noted  . S/P total knee replacement 09/17/2015    [name] P, PTA 11/24/2015, 9:58 AM  [facility] [place], [place], [address] Phone: [phone]   Fax:  [phone]  Name: Mary Sims MRN: [id number] Date of Birth: 07-09-1955

## 2015-11-26 ENCOUNTER — Encounter: Payer: BLUE CROSS/BLUE SHIELD | Admitting: Physical Therapy

## 2015-11-28 ENCOUNTER — Ambulatory Visit: Payer: BLUE CROSS/BLUE SHIELD | Attending: Orthopedic Surgery | Admitting: *Deleted

## 2015-11-28 DIAGNOSIS — R600 Localized edema: Secondary | ICD-10-CM | POA: Insufficient documentation

## 2015-11-28 DIAGNOSIS — M25562 Pain in left knee: Secondary | ICD-10-CM | POA: Diagnosis not present

## 2015-11-28 DIAGNOSIS — M25662 Stiffness of left knee, not elsewhere classified: Secondary | ICD-10-CM | POA: Insufficient documentation

## 2015-11-28 NOTE — Therapy (Signed)
[facility] [address] [place], [place], [address] Phone: [phone]   Fax:  [phone]  Physical Therapy Treatment  Patient Details  Name: Mary Sims MRN: [id number] Date of Birth: 09-26-1955 Referring Provider: Mckinley Jewel MD  Encounter Date: 11/28/2015      PT End of Session - 11/28/15 0951    Visit Number 12   Number of Visits 16   Date for PT Re-Evaluation 12/08/15   PT Start Time 0900   PT Stop Time 0947   PT Time Calculation (min) 47 min      Past Medical History:  Diagnosis Date  . Anxiety   . Arthritis    "elbows, hands, knees, thumbs" (09/17/2015)  . Chronic bronchitis (HCC)   . Complication of anesthesia    slow to wake up after rhinoplasty  . Frequent UTI    "1-2 times/year; every year" (09/17/2015)  . GERD (gastroesophageal reflux disease)   . PONV (postoperative nausea and vomiting)   . Walking pneumonia 2004    Past Surgical History:  Procedure Laterality Date  . CATARACT EXTRACTION W/ INTRAOCULAR LENS  IMPLANT, BILATERAL Bilateral 2000s  . DILATION AND CURETTAGE OF UTERUS  1999  . JOINT REPLACEMENT    . RHINOPLASTY  2008  . TOTAL KNEE ARTHROPLASTY Left 09/17/2015  . TOTAL KNEE ARTHROPLASTY Left 09/17/2015   Procedure: TOTAL KNEE ARTHROPLASTY;  Surgeon: Loreta Ave, MD;  Location: [facility] OR;  Service: Orthopedics;  Laterality: Left;  . TRIGGER FINGER RELEASE Left ~ 2003   thumb  . TUBAL LIGATION  1981  . VAGINAL HYSTERECTOMY  1999    There were no vitals filed for this visit.      Subjective Assessment - 11/28/15 0909    Subjective Patient reported improvement overall and and more soreness at night or with prolong standing.   Patient Stated Goals Get back to normal.   Currently in Pain? No/denies                         [facility] Adult PT Treatment/Exercise - 11/28/15 0001      Knee/Hip Exercises: Aerobic   Nustep L5 x57min      Knee/Hip Exercises: Standing   Lateral Step  Up Left;Step Height: 6";1 set;15 reps   Forward Step Up Left;Step Height: 6";1 set;15 reps   Step Down --  and x5   Rocker Board 3 minutes  calf stretching     Manual Therapy   Manual Therapy Passive ROM   Soft tissue mobilization STW/M to right quadriceps and PROM into flexion with Pt supinex 20 mins   Passive ROM --                     PT Long Term Goals - 11/24/15 0934      PT LONG TERM GOAL #1   Title Ind with a HEP.   Time 8   Period Weeks   Status Achieved     PT LONG TERM GOAL #2   Title Active knee flexion to 120-125 degrees+ so the patient can perform functional tasks and do so with pain not > 2-3/10.   Time 8   Period Weeks   Status On-going  AROM 105 degrees 11/24/15     PT LONG TERM GOAL #3   Title Decrease edema to within 2 cms of non-affected side to assist with pain reduction and range of motion gains.   Time 8   Period Weeks  Status Achieved  1cm 11/24/15     PT LONG TERM GOAL #4   Title Perform a reciprocating stair gait with one railing with pain not > 2-3/10.   Time 8   Period Weeks   Status Achieved               Plan - 11/28/15 0951    Clinical Impression Statement Pt did great today with Rx today and was able to reach AROM 0-112 and PROM 0-120 degrees today. She felt tightness mainly in her quad during flexion stretch and responded to STW. Goals are on-going   Rehab Potential Excellent   PT Frequency 2x / week   PT Duration 8 weeks   PT Next Visit Plan Continue per TKR protocol per MPT POC with modalities PRN for pain/edema. (MD. Eulah PontMurphy 12/09/15)   PT Home Exercise Plan heel tap   Consulted and Agree with Plan of Care Patient      Patient will benefit from skilled therapeutic intervention in order to improve the following deficits and impairments:  Pain, Decreased activity tolerance, Decreased strength, Increased edema, Decreased range of motion  Visit Diagnosis: Stiffness of left knee, not elsewhere classified  Pain  in left knee     Problem List Patient Active Problem List   Diagnosis Date Noted  . S/P total knee replacement 09/17/2015    Keshun Berrett,CHRIS, PTA 11/28/2015, 10:52 AM  [facility] [address] [place], [place], [address] Phone: [phone]   Fax:  [phone]  Name: Mary Sims MRN: [id number] Date of Birth: 03-Jan-1956

## 2015-12-02 ENCOUNTER — Ambulatory Visit: Payer: BLUE CROSS/BLUE SHIELD | Admitting: Physical Therapy

## 2015-12-02 DIAGNOSIS — M25662 Stiffness of left knee, not elsewhere classified: Secondary | ICD-10-CM

## 2015-12-02 DIAGNOSIS — M25562 Pain in left knee: Secondary | ICD-10-CM

## 2015-12-02 DIAGNOSIS — R600 Localized edema: Secondary | ICD-10-CM

## 2015-12-02 DIAGNOSIS — E669 Obesity, unspecified: Secondary | ICD-10-CM | POA: Diagnosis not present

## 2015-12-02 DIAGNOSIS — K210 Gastro-esophageal reflux disease with esophagitis: Secondary | ICD-10-CM | POA: Diagnosis not present

## 2015-12-02 DIAGNOSIS — M1990 Unspecified osteoarthritis, unspecified site: Secondary | ICD-10-CM | POA: Diagnosis not present

## 2015-12-02 NOTE — Therapy (Signed)
[facility] [address] [place], [place], [address] Phone: [phone]   Fax:  [phone]  Physical Therapy Treatment  Patient Details  Name: Mary Sims MRN: [id number] Date of Birth: 10-13-1955 Referring Provider: Mckinley Jewelaniel Murphy MD  Encounter Date: 12/02/2015      PT End of Session - 12/02/15 1712    Visit Number 13   Number of Visits 16   Date for PT Re-Evaluation 12/08/15   PT Start Time 0315   PT Stop Time 0408   PT Time Calculation (min) 53 min   Activity Tolerance Patient tolerated treatment well   Behavior During Therapy [facility] for tasks assessed/performed      Past Medical History:  Diagnosis Date  . Anxiety   . Arthritis    "elbows, hands, knees, thumbs" (09/17/2015)  . Chronic bronchitis (HCC)   . Complication of anesthesia    slow to wake up after rhinoplasty  . Frequent UTI    "1-2 times/year; every year" (09/17/2015)  . GERD (gastroesophageal reflux disease)   . PONV (postoperative nausea and vomiting)   . Walking pneumonia 2004    Past Surgical History:  Procedure Laterality Date  . CATARACT EXTRACTION W/ INTRAOCULAR LENS  IMPLANT, BILATERAL Bilateral 2000s  . DILATION AND CURETTAGE OF UTERUS  1999  . JOINT REPLACEMENT    . RHINOPLASTY  2008  . TOTAL KNEE ARTHROPLASTY Left 09/17/2015  . TOTAL KNEE ARTHROPLASTY Left 09/17/2015   Procedure: TOTAL KNEE ARTHROPLASTY;  Surgeon: Loreta Aveaniel F Murphy, MD;  [facility];  Service: Orthopedics;  Laterality: Left;  . TRIGGER FINGER RELEASE Left ~ 2003   thumb  . TUBAL LIGATION  1981  . VAGINAL HYSTERECTOMY  1999    There were no vitals filed for this visit.      Subjective Assessment - 12/02/15 1711    Subjective I walked 5 miles at a flea market and did house cleaning and my knee did great.   Patient Stated Goals Get back to normal.     Treatment:  Bike x 18 minutes f/b knee extension at 10# x 5 minutes f/b ham curls with 30# x 5 minutes f/b IFC and medium  vasopneumatic to left knee x 15 minutes. Excellent job today.  Perform left quad STW/M for patient's next and last scheduled visit.                                 PT Long Term Goals - 11/24/15 0934      PT LONG TERM GOAL #1   Title Ind with a HEP.   Time 8   Period Weeks   Status Achieved     PT LONG TERM GOAL #2   Title Active knee flexion to 120-125 degrees+ so the patient can perform functional tasks and do so with pain not > 2-3/10.   Time 8   Period Weeks   Status On-going  AROM 105 degrees 11/24/15     PT LONG TERM GOAL #3   Title Decrease edema to within 2 cms of non-affected side to assist with pain reduction and range of motion gains.   Time 8   Period Weeks   Status Achieved  1cm 11/24/15     PT LONG TERM GOAL #4   Title Perform a reciprocating stair gait with one railing with pain not > 2-3/10.   Time 8   Period Weeks   Status Achieved  Patient will benefit from skilled therapeutic intervention in order to improve the following deficits and impairments:  Pain, Decreased activity tolerance, Decreased strength, Increased edema, Decreased range of motion  Visit Diagnosis: Stiffness of left knee, not elsewhere classified  Pain in left knee  Localized edema     Problem List Patient Active Problem List   Diagnosis Date Noted  . S/P total knee replacement 09/17/2015    Jahsir Rama, [place] 12/02/2015, 5:20 PM  [facility] [address] [place], [place], [address] Phone: [phone]   Fax:  [phone]  Name: Mary Sims MRN: [id number] Date of Birth: [birth year]

## 2015-12-08 ENCOUNTER — Encounter: Payer: Self-pay | Admitting: Physical Therapy

## 2015-12-08 ENCOUNTER — Ambulatory Visit: Payer: BLUE CROSS/BLUE SHIELD | Admitting: Physical Therapy

## 2015-12-08 DIAGNOSIS — M25662 Stiffness of left knee, not elsewhere classified: Secondary | ICD-10-CM | POA: Diagnosis not present

## 2015-12-08 DIAGNOSIS — M25562 Pain in left knee: Secondary | ICD-10-CM

## 2015-12-08 DIAGNOSIS — R600 Localized edema: Secondary | ICD-10-CM | POA: Diagnosis not present

## 2015-12-08 NOTE — Therapy (Addendum)
[facility] [place], [place], [address] Phone: [phone]   Fax:  [phone]  Physical Therapy Treatment  Patient Details  Name: Mary Sims MRN: [id number] Date of Birth: February 15, 1956 Referring Provider: Marland Kitchen  Encounter Date: 12/08/2015      PT End of Session - 12/08/15 1530    Visit Number 14   Number of Visits 16   Date for PT Re-Evaluation 12/08/15   PT Start Time 3291   PT Stop Time 1601   PT Time Calculation (min) 47 min   Activity Tolerance Patient tolerated treatment well   Behavior During Therapy [facility] for tasks assessed/performed      Past Medical History:  Diagnosis Date  . Anxiety   . Arthritis    "elbows, hands, knees, thumbs" (09/17/2015)  . Chronic bronchitis ([place])   . Complication of anesthesia    slow to wake up after rhinoplasty  . Frequent UTI    "1-2 times/year; every year" (09/17/2015)  . GERD (gastroesophageal reflux disease)   . PONV (postoperative nausea and vomiting)   . Walking pneumonia 2004    Past Surgical History:  Procedure Laterality Date  . CATARACT EXTRACTION W/ INTRAOCULAR LENS  IMPLANT, BILATERAL Bilateral 2000s  . DILATION AND CURETTAGE OF UTERUS  1999  . JOINT REPLACEMENT    . RHINOPLASTY  2008  . TOTAL KNEE ARTHROPLASTY Left 09/17/2015  . TOTAL KNEE ARTHROPLASTY Left 09/17/2015   Procedure: TOTAL KNEE ARTHROPLASTY;  Surgeon: Ninetta Lights, MD;  Location: [facility];  Service: Orthopedics;  Laterality: Left;  . TRIGGER FINGER RELEASE Left ~ 2003   thumb  . TUBAL LIGATION  1981  . VAGINAL HYSTERECTOMY  1999    There were no vitals filed for this visit.      Subjective Assessment - 12/08/15 1529    Subjective Reports that she goes to MD and is ready to return to work.   Patient Stated Goals Get back to normal.   Currently in Pain? Other (Comment)  No reports of pain prior to treatment            [facility] PT Assessment - 12/08/15 0001      Assessment   Medical  Diagnosis Left total knee replacement.   Referring Provider `   Onset Date/Surgical Date 09/17/15   Next MD Visit 12/09/2015     Precautions   Precaution Comments No ultrasound.     Restrictions   Weight Bearing Restrictions No     ROM / Strength   AROM / PROM / Strength AROM     AROM   Overall AROM  Within functional limits for tasks performed   AROM Assessment Site Knee   Right/Left Knee Left   Left Knee Extension 0   Left Knee Flexion 120                     [facility] Adult PT Treatment/Exercise - 12/08/15 0001      Knee/Hip Exercises: Aerobic   Stationary Bike L2 x17 min     Knee/Hip Exercises: Machines for Strengthening   Cybex Knee Extension 10# 3x10 reps   Cybex Knee Flexion 40# 3x10 reps   Cybex Leg Press Seat 6, 3 plates, 3x10 reps     Modalities   Modalities Electrical Stimulation;Vasopneumatic     Electrical Stimulation   Electrical Stimulation Location L knee   Electrical Stimulation Action IFC   Electrical Stimulation Parameters 1-10 HZ X15 MIN   Electrical Stimulation Goals Edema;Pain  Vasopneumatic   Number Minutes Vasopneumatic  15 minutes   Vasopnuematic Location  Knee   Vasopneumatic Pressure Medium   Vasopneumatic Temperature  52                     PT Long Term Goals - 12/08/15 1547      PT LONG TERM GOAL #1   Title Ind with a HEP.   Time 8   Period Weeks   Status Achieved     PT LONG TERM GOAL #2   Title Active knee flexion to 120-125 degrees+ so the patient can perform functional tasks and do so with pain not > 2-3/10.   Time 8   Period Weeks   Status Achieved  AROM L knee flex 120 deg 12/08/2015     PT LONG TERM GOAL #3   Title Decrease edema to within 2 cms of non-affected side to assist with pain reduction and range of motion gains.   Time 8   Period Weeks   Status Achieved  1cm 11/24/15     PT LONG TERM GOAL #4   Title Perform a reciprocating stair gait with one railing with pain not > 2-3/10.    Time 8   Period Weeks   Status Achieved               Plan - 12/08/15 1558    Clinical Impression Statement Patient has progressed well since a L TKR and has now achieved all goals set at evaluation. Patient able to tolerate machine knee strengthening without complaint at this time. Patient does have limitation with Quad tightness that at limits knee flexion at times. AROM L knee measured as 0-120 deg today in supine with no pain reports. Normal modalities response noted following removal of the modalities.   Rehab Potential Excellent   PT Frequency 2x / week   PT Duration 8 weeks   PT Treatment/Interventions ADLs/Self Care Home Management;Cryotherapy;Electrical Stimulation;Therapeutic exercise;Therapeutic activities;Patient/family education;Manual techniques;Vasopneumatic Device;Passive range of motion   PT Next Visit Plan Continue or D/C per MD discretion.   PT Home Exercise Plan heel tap   Consulted and Agree with Plan of Care Patient      Patient will benefit from skilled therapeutic intervention in order to improve the following deficits and impairments:  Pain, Decreased activity tolerance, Decreased strength, Increased edema, Decreased range of motion  Visit Diagnosis: Stiffness of left knee, not elsewhere classified  Pain in left knee  Localized edema     Problem List Patient Active Problem List   Diagnosis Date Noted  . S/P total knee replacement 09/17/2015    Ahmed Prima, PTA 12/08/15 4:20 PM Mali Applegate MPT [facility] [address] [place], [place], [address] Phone: [phone]   Fax:  [phone]  Name: Mary Sims MRN: [id number] Date of Birth: 01-Sep-1955  PHYSICAL THERAPY DISCHARGE SUMMARY  Visits from Start of Care: 14.  Current functional level related to goals / functional outcomes: See above.   Remaining deficits: All goals met.   Education / Equipment: HEP. Plan: Patient agrees to  discharge.  Patient goals were met. Patient is being discharged due to meeting the stated rehab goals.  ?????          Mali Applegate MPT

## 2015-12-09 DIAGNOSIS — Z96652 Presence of left artificial knee joint: Secondary | ICD-10-CM | POA: Diagnosis not present

## 2016-02-22 DIAGNOSIS — Z23 Encounter for immunization: Secondary | ICD-10-CM | POA: Diagnosis not present

## 2016-04-27 DIAGNOSIS — M25532 Pain in left wrist: Secondary | ICD-10-CM | POA: Diagnosis not present

## 2016-04-27 DIAGNOSIS — M25522 Pain in left elbow: Secondary | ICD-10-CM | POA: Diagnosis not present

## 2016-04-27 DIAGNOSIS — M25531 Pain in right wrist: Secondary | ICD-10-CM | POA: Diagnosis not present

## 2016-05-03 ENCOUNTER — Other Ambulatory Visit: Payer: Self-pay | Admitting: *Deleted

## 2016-05-03 DIAGNOSIS — G5601 Carpal tunnel syndrome, right upper limb: Secondary | ICD-10-CM

## 2016-05-04 ENCOUNTER — Ambulatory Visit (INDEPENDENT_AMBULATORY_CARE_PROVIDER_SITE_OTHER): Payer: BLUE CROSS/BLUE SHIELD | Admitting: Neurology

## 2016-05-04 DIAGNOSIS — M79641 Pain in right hand: Secondary | ICD-10-CM

## 2016-05-04 DIAGNOSIS — G5601 Carpal tunnel syndrome, right upper limb: Secondary | ICD-10-CM | POA: Diagnosis not present

## 2016-05-04 NOTE — Procedures (Signed)
[facility]  [address], [address]  [place], [place] [address] Tel: [phone] Fax:  [phone] Test Date:  05/04/2016  Patient: Mary Sims DOB: 06-Mar-1956 Physician: Nita Sickleonika Romond Pipkins, DO  Sex: Female Height: 5'3" Ref Phys: Mckinley Jewelaniel Murphy, M.D.  ID#: [id number] Temp: 33.9C Technician: Judie PetitM. Dean   Patient Complaints: This is a 61 year old female referred for evaluation of right hand pain and paresthesias.   NCV & EMG Findings: Extensive electrodiagnostic testing of the right upper extremity shows:  1. Right median, ulnar, and mixed palmar sensory responses are within normal limits. 2. Right median and ulnar motor responses are within normal limits. 3. There is no evidence of active or chronic motor axon loss changes affecting any of the tested muscles. Motor unit configuration and recruitment pattern is within normal limits.  Impression: This is a normal study of the right upper extremity.   In particular, there is no evidence of carpal tunnel syndrome or a cervical radiculopathy.   ___________________________ Nita Sickleonika Sutter Ahlgren, DO    Nerve Conduction Studies Anti Sensory Summary Table   Site NR Peak (ms) Norm Peak (ms) P-T Amp (V) Norm P-T Amp  Right Median Anti Sensory (2nd Digit)  Wrist    3.3 <3.8 28.1 >10  Right Ulnar Anti Sensory (5th Digit)  Wrist    2.9 <3.2 20.5 >5   Motor Summary Table   Site NR Onset (ms) Norm Onset (ms) O-P Amp (mV) Norm O-P Amp Site1 Site2 Delta-0 (ms) Dist (cm) Vel (m/s) Norm Vel (m/s)  Right Median Motor (Abd Poll Brev)  Wrist    3.4 <4.0 6.9 >5 Elbow Wrist 4.0 22.0 55 >50  Elbow    7.4  6.5         Right Ulnar Motor (Abd Dig Minimi)  Wrist    2.5 <3.1 8.9 >7 B Elbow Wrist 3.3 19.0 58 >50  B Elbow    5.8  8.7  A Elbow B Elbow 1.6 10.0 62 >50  A Elbow    7.4  8.7          Comparison Summary Table   Site NR Peak (ms) Norm Peak (ms) P-T Amp (V) Site1 Site2 Delta-P (ms) Norm Delta (ms)  Right Median/Ulnar Palm Comparison  (Wrist - 8cm)  Median Palm    1.8 <2.2 58.0 Median Palm Ulnar Palm 0.3   Ulnar Palm    1.5 <2.2 19.1       EMG   Side Muscle Ins Act Fibs Psw Fasc Number Recrt Dur Dur. Amp Amp. Poly Poly. Comment  Right 1stDorInt Nml Nml Nml Nml Nml Nml Nml Nml Nml Nml Nml Nml N/A  Right Abd Poll Brev Nml Nml Nml Nml Nml Nml Nml Nml Nml Nml Nml Nml N/A  Right Ext Indicis Nml Nml Nml Nml Nml Nml Nml Nml Nml Nml Nml Nml N/A  Right PronatorTeres Nml Nml Nml Nml Nml Nml Nml Nml Nml Nml Nml Nml N/A  Right Biceps Nml Nml Nml Nml Nml Nml Nml Nml Nml Nml Nml Nml N/A  Right Triceps Nml Nml Nml Nml Nml Nml Nml Nml Nml Nml Nml Nml N/A  Right Deltoid Nml Nml Nml Nml Nml Nml Nml Nml Nml Nml Nml Nml N/A      Waveforms:

## 2016-05-21 DIAGNOSIS — M25522 Pain in left elbow: Secondary | ICD-10-CM | POA: Diagnosis not present

## 2016-05-26 DIAGNOSIS — M25531 Pain in right wrist: Secondary | ICD-10-CM | POA: Diagnosis not present

## 2016-05-31 DIAGNOSIS — J029 Acute pharyngitis, unspecified: Secondary | ICD-10-CM | POA: Diagnosis not present

## 2016-05-31 DIAGNOSIS — J00 Acute nasopharyngitis [common cold]: Secondary | ICD-10-CM | POA: Diagnosis not present

## 2016-05-31 DIAGNOSIS — Z20828 Contact with and (suspected) exposure to other viral communicable diseases: Secondary | ICD-10-CM | POA: Diagnosis not present

## 2016-05-31 DIAGNOSIS — B349 Viral infection, unspecified: Secondary | ICD-10-CM | POA: Diagnosis not present

## 2016-09-15 DIAGNOSIS — X32XXXA Exposure to sunlight, initial encounter: Secondary | ICD-10-CM | POA: Diagnosis not present

## 2016-09-15 DIAGNOSIS — L570 Actinic keratosis: Secondary | ICD-10-CM | POA: Diagnosis not present

## 2016-09-15 DIAGNOSIS — L820 Inflamed seborrheic keratosis: Secondary | ICD-10-CM | POA: Diagnosis not present

## 2016-09-15 DIAGNOSIS — D225 Melanocytic nevi of trunk: Secondary | ICD-10-CM | POA: Diagnosis not present

## 2016-09-15 DIAGNOSIS — C44319 Basal cell carcinoma of skin of other parts of face: Secondary | ICD-10-CM | POA: Diagnosis not present

## 2016-10-11 DIAGNOSIS — L570 Actinic keratosis: Secondary | ICD-10-CM | POA: Diagnosis not present

## 2016-10-11 DIAGNOSIS — X32XXXD Exposure to sunlight, subsequent encounter: Secondary | ICD-10-CM | POA: Diagnosis not present

## 2016-10-15 ENCOUNTER — Ambulatory Visit (HOSPITAL_COMMUNITY)
Admission: RE | Admit: 2016-10-15 | Discharge: 2016-10-15 | Disposition: A | Discharge status: Home / Self Care | Payer: BLUE CROSS/BLUE SHIELD | Source: Ambulatory Visit | Attending: Pulmonary Disease | Admitting: Pulmonary Disease

## 2016-10-15 ENCOUNTER — Other Ambulatory Visit (HOSPITAL_COMMUNITY): Payer: Self-pay | Admitting: Pulmonary Disease

## 2016-10-15 DIAGNOSIS — M25559 Pain in unspecified hip: Secondary | ICD-10-CM | POA: Insufficient documentation

## 2016-10-15 DIAGNOSIS — M79671 Pain in right foot: Secondary | ICD-10-CM

## 2016-10-15 DIAGNOSIS — M79672 Pain in left foot: Secondary | ICD-10-CM | POA: Diagnosis not present

## 2016-10-15 DIAGNOSIS — M19072 Primary osteoarthritis, left ankle and foot: Secondary | ICD-10-CM | POA: Insufficient documentation

## 2016-10-15 DIAGNOSIS — M160 Bilateral primary osteoarthritis of hip: Secondary | ICD-10-CM | POA: Insufficient documentation

## 2016-10-15 DIAGNOSIS — M19071 Primary osteoarthritis, right ankle and foot: Secondary | ICD-10-CM | POA: Diagnosis not present

## 2016-10-15 DIAGNOSIS — I10 Essential (primary) hypertension: Secondary | ICD-10-CM | POA: Diagnosis not present

## 2016-10-15 DIAGNOSIS — K219 Gastro-esophageal reflux disease without esophagitis: Secondary | ICD-10-CM | POA: Diagnosis not present

## 2016-10-15 DIAGNOSIS — M25551 Pain in right hip: Secondary | ICD-10-CM

## 2016-10-15 DIAGNOSIS — M25552 Pain in left hip: Secondary | ICD-10-CM

## 2016-12-23 DIAGNOSIS — J029 Acute pharyngitis, unspecified: Secondary | ICD-10-CM | POA: Diagnosis not present

## 2016-12-23 DIAGNOSIS — J209 Acute bronchitis, unspecified: Secondary | ICD-10-CM | POA: Diagnosis not present

## 2016-12-23 DIAGNOSIS — R05 Cough: Secondary | ICD-10-CM | POA: Diagnosis not present

## 2016-12-23 DIAGNOSIS — R5383 Other fatigue: Secondary | ICD-10-CM | POA: Diagnosis not present

## 2017-01-25 ENCOUNTER — Ambulatory Visit (INDEPENDENT_AMBULATORY_CARE_PROVIDER_SITE_OTHER): Payer: BLUE CROSS/BLUE SHIELD | Admitting: *Deleted

## 2017-01-25 DIAGNOSIS — Z23 Encounter for immunization: Secondary | ICD-10-CM | POA: Diagnosis not present

## 2017-05-10 DIAGNOSIS — M542 Cervicalgia: Secondary | ICD-10-CM | POA: Diagnosis not present

## 2017-05-10 DIAGNOSIS — M75102 Unspecified rotator cuff tear or rupture of left shoulder, not specified as traumatic: Secondary | ICD-10-CM | POA: Diagnosis not present

## 2017-05-16 ENCOUNTER — Other Ambulatory Visit: Payer: Self-pay

## 2017-05-16 ENCOUNTER — Ambulatory Visit: Payer: BLUE CROSS/BLUE SHIELD | Attending: Orthopedic Surgery | Admitting: Physical Therapy

## 2017-05-16 DIAGNOSIS — M6281 Muscle weakness (generalized): Secondary | ICD-10-CM | POA: Diagnosis not present

## 2017-05-16 DIAGNOSIS — M25512 Pain in left shoulder: Secondary | ICD-10-CM | POA: Diagnosis not present

## 2017-05-16 NOTE — Patient Instructions (Signed)
[facility]  DRY NEEDLING CONSENT FORM   Trigger point dry needling is a physical therapy approach to treat Myofascial Pain and Dysfunction.  Dry Needling (DN) is a valuable and effective way to deactivate myofascial trigger points (muscle knots/pain). It is skilled intervention that uses a thin filiform needle to penetrate the skin and stimulate underlying myofascial trigger points, muscular, and connective tissues for the management of neuromusculoskeletal pain and movement impairments.  A local twitch response (LTR) will be elicited.  This can sometimes feel like a deep ache in the muscle during the procedure. Multiple trigger points in multiple muscles can be treated during each treatment.  No medication of any kind is injected.   As with any medical treatment and procedure, there are possible adverse events.  While significant adverse events are uncommon, they do sometimes occur and must be considered prior to giving consent.  1. Dry needling often causes a "post needling soreness".  There can be an increase in pain from a couple of hours to 2-3 days, followed by an improvement in the overall pain state. 2. Any time a needle is used there is a risk of infection.  However, we are using new, sterile, and disposable needles; infections are extremely rare. 3. There is a possibility that you may bleed or bruise.  You may feel tired and some nausea following treatment. 4. There is a rare possibility of a pneumothorax (air in the chest cavity). 5. Allergic reaction to nickel in the stainless steel needle. 6. If a nerve is touched, it may cause paresthesia (a prickling/shock sensation) which is usually brief, but may continue for a couple of days.  Following treatment stay hydrated.  Continue regular activities but not too vigorous initially after treatment for 24-48 hours.  Dry Needling is best when combined with other physical therapy interventions such as  strengthening, stretching and other therapeutic modalities.   PLEASE ANSWER THE FOLLOWING QUESTIONS:  Do you have a lack of sensation?   Y/N  Do you have a phobia or fear of needles  Y/N  Are you pregnant?    Y/N If yes:  How many weeks? __________ Do you have any implanted devices?  Y/N If yes:  Pacemaker/Spinal Cord Stimulator/Deep Brain Stimulator/Insulin Pump/Other: ________________ Do you have any implants?  Y/N If yes: Breast/Facial/Pecs/Buttocks/Calves/Hip  Replacement/ Knee Replacement/Other: _________ Do you take any blood thinners?   Y/N If yes: Coumadin (Warfarin)/Other: ___________________ Do you have a bleeding disorder?   Y/N If yes: What kind: _________________________________ Do you take any immunosuppressants?  Y/N If yes:   What kind: _________________________________ Do you take anti-inflammatories?   Y/N If yes: What kind: Advil/Aspirin/Other: ________________ Have you ever been diagnosed with Scoliosis? Y/N Have you had back surgery?   Y/N If yes:  Laminectomy/Fusion/Other: ___________________   I have read, or had read to me, the above.  I have had the opportunity to ask any questions.  All of my questions have been answered to my satisfaction and I understand the risks involved with dry needling.  I consent to examination and treatment at [facility], including dry needling, of any and all of my involved and affected muscles.  

## 2017-05-16 NOTE — Therapy (Signed)
[facility] [address] [place], [place], [address] Phone: [phone]   Fax:  [phone]  Physical Therapy Treatment  Patient Details  Name: Mary Sims MRN: [id number] Date of Birth: 1955-09-16 Referring Provider: Lunette Stands MD   Encounter Date: 05/16/2017  PT End of Session - 05/16/17 1304    Visit Number  1    Number of Visits  12    Date for PT Re-Evaluation  06/27/17    PT Start Time  1115    PT Stop Time  1206    PT Time Calculation (min)  51 min    Activity Tolerance  Patient tolerated treatment well    Behavior During Therapy  [facility] for tasks assessed/performed       Past Medical History:  Diagnosis Date  . Anxiety   . Arthritis    "elbows, hands, knees, thumbs" (09/17/2015)  . Chronic bronchitis ([facility])   . Complication of anesthesia    slow to wake up after rhinoplasty  . Frequent UTI    "1-2 times/year; every year" (09/17/2015)  . GERD (gastroesophageal reflux disease)   . PONV (postoperative nausea and vomiting)   . Walking pneumonia 2004    Past Surgical History:  Procedure Laterality Date  . CATARACT EXTRACTION W/ INTRAOCULAR LENS  IMPLANT, BILATERAL Bilateral 2000s  . DILATION AND CURETTAGE OF UTERUS  1999  . JOINT REPLACEMENT    . RHINOPLASTY  2008  . TOTAL KNEE ARTHROPLASTY Left 09/17/2015  . TOTAL KNEE ARTHROPLASTY Left 09/17/2015   Procedure: TOTAL KNEE ARTHROPLASTY;  Surgeon: Loreta Ave, MD;  Location: [facility] OR;  Service: Orthopedics;  Laterality: Left;  . TRIGGER FINGER RELEASE Left ~ 2003   thumb  . TUBAL LIGATION  1981  . VAGINAL HYSTERECTOMY  1999    There were no vitals filed for this visit.  Subjective Assessment - 05/16/17 1311    Subjective  The patient reports falling in mid-December 2018.  This resulted in a great deal of left shoulder pain.  Pain has been very high with certain movements and her sleep is disturbed by pain.  A recent injection was quite helpful.      Pertinent History   Arthritis.    Diagnostic tests  X-ray.  "I have a bone spur."    Patient Stated Goals  Use left UE without pain.    Currently in Pain?  Yes    Pain Score  6     Pain Location  Shoulder    Pain Orientation  Left    Pain Descriptors / Indicators  Aching;Throbbing    Pain Type  Acute pain    Pain Radiating Towards  Left UE.    Pain Onset  More than a month ago    Pain Frequency  Constant    Aggravating Factors   Movement.    Pain Relieving Factors  Rest.         [facility] PT Assessment - 05/16/17 0001      Assessment   Medical Diagnosis  Left rotator shoulder impingement.    Referring Provider  Lunette Stands MD    Onset Date/Surgical Date  -- Mid-December 2018.      Precautions   Precautions  --      Restrictions   Weight Bearing Restrictions  No      Balance Screen   Has the patient fallen in the past 6 months  Yes    How many times?  -- 1.    Has the patient  had a decrease in activity level because of a fear of falling?   No    Is the patient reluctant to leave their home because of a fear of falling?   No      Home Environment   Living Environment  Private residence      Prior Function   Level of Independence  Independent      Posture/Postural Control   Posture/Postural Control  Postural limitations    Postural Limitations  Rounded Shoulders;Forward head      ROM / Strength   AROM / PROM / Strength  AROM;Strength      AROM   Overall AROM Comments  Full active left shoulder range of motion.  Patient states this is much improved since receiving the injection.      Strength   Overall Strength Comments  Left shoulder ER= 4-/5.      Palpation   Palpation comment  Tender with trigger point over left Infraspinatus muscle belly and especially toward the humeral insertion.      Special Tests    Special Tests  Rotator Cuff Impingement    Other special tests  Bilateral bicep reflexes are absent though brachioradialis and Tricep reflexes are normal.    Rotator Cuff  Impingment tests  Leanord Asal test;Drop Arm test      Hawkins-Kennedy test   Findings  Positive    Side  Left      Drop Arm test   Findings  Negative    Side  Left      Ambulation/Gait   Gait Comments  WNL.                  [facility] Adult PT Treatment/Exercise - 05/16/17 0001      Modalities   Modalities  Electrical Stimulation;Moist Heat      Moist Heat Therapy   Number Minutes Moist Heat  20 Minutes    Moist Heat Location  -- Left shoulder.      Programme researcher, broadcasting/film/video Location  Left shoulder.    Electrical Stimulation Action  IFC    Electrical Stimulation Parameters  80-150 Hz x 20 minutes.    Electrical Stimulation Goals  Pain                  PT Long Term Goals - 05/16/17 1331      PT LONG TERM GOAL #1   Title  Ind with a HEP.    Time  6    Period  Weeks    Status  New      PT LONG TERM GOAL #2   Title  Right shoulder ER strength a solid 5/5.    Time  6    Period  Weeks    Status  New      PT LONG TERM GOAL #3   Title  Sleep 6 hours undisturbed.    Time  6    Period  Weeks    Status  New      PT LONG TERM GOAL #4   Title  Perform ADL's with pain not > 2/10.    Time  6    Period  Weeks    Status  New            Plan - 05/16/17 1327    Clinical Impression Statement  The patient presents to OPPT with c/o left shoulder pain due to a fall in mid-December of 2018.  Her range is motion is  normal though she is weak into left shoulder ER.  She is palpably tender over her left Infraspinatus.  Patient will do well with skilled physical therapy intervention.    History and Personal Factors relevant to plan of care:  Arhritis.    Clinical Presentation  Stable    Clinical Presentation due to:  Injection helped a lot.    Clinical Decision Making  Low    Rehab Potential  Excellent    PT Frequency  2x / week    PT Duration  6 weeks    PT Treatment/Interventions  ADLs/Self Care Home  Management;Cryotherapy;Electrical Stimulation;Ultrasound;Moist Heat;Therapeutic activities;Therapeutic exercise;Patient/family education;Manual techniques;Dry needling    PT Next Visit Plan  Combo e'stim/U/S to left shoulder; STW/M to include IASTM; RW4; SDLY ER; full can.      Consulted and Agree with Plan of Care  Patient       Patient will benefit from skilled therapeutic intervention in order to improve the following deficits and impairments:  Decreased activity tolerance, Decreased strength, Postural dysfunction, Pain  Visit Diagnosis: Acute pain of left shoulder - Plan: PT plan of care cert/re-cert  Muscle weakness (generalized) - Plan: PT plan of care cert/re-cert     Problem List Patient Active Problem List   Diagnosis Date Noted  . S/P total knee replacement 09/17/2015    Kaaren Nass, [place] MPT 05/16/2017, 1:37 PM  [facility] [address] [place], [place], [address] Phone: [phone]   Fax:  [phone]  Name: Mary Seamela Sims MRN: [id number] Date of Birth: 1955/10/11

## 2017-05-20 ENCOUNTER — Encounter: Payer: Self-pay | Admitting: Physical Therapy

## 2017-05-20 ENCOUNTER — Ambulatory Visit: Payer: BLUE CROSS/BLUE SHIELD | Admitting: Physical Therapy

## 2017-05-20 DIAGNOSIS — M6281 Muscle weakness (generalized): Secondary | ICD-10-CM | POA: Diagnosis not present

## 2017-05-20 DIAGNOSIS — M25512 Pain in left shoulder: Secondary | ICD-10-CM | POA: Diagnosis not present

## 2017-05-20 NOTE — Therapy (Signed)
[facility] [address] [place], [place], [address] Phone: [phone]   Fax:  [phone]  Physical Therapy Treatment  Patient Details  Name: Mary Sims MRN: [id number] Date of Birth: 1956-02-27 Referring Provider: Lunette Stands MD   Encounter Date: 05/20/2017  PT End of Session - 05/20/17 1121    Visit Number  2    Number of Visits  12    Date for PT Re-Evaluation  06/27/17    Authorization Type  FOTO AT LEAST EVERY 5TH VISIT.    PT Start Time  1118    PT Stop Time  1209    PT Time Calculation (min)  51 min    Activity Tolerance  Patient tolerated treatment well    Behavior During Therapy  WFL for tasks assessed/performed       Past Medical History:  Diagnosis Date  . Anxiety   . Arthritis    "elbows, hands, knees, thumbs" (09/17/2015)  . Chronic bronchitis (HCC)   . Complication of anesthesia    slow to wake up after rhinoplasty  . Frequent UTI    "1-2 times/year; every year" (09/17/2015)  . GERD (gastroesophageal reflux disease)   . PONV (postoperative nausea and vomiting)   . Walking pneumonia 2004    Past Surgical History:  Procedure Laterality Date  . CATARACT EXTRACTION W/ INTRAOCULAR LENS  IMPLANT, BILATERAL Bilateral 2000s  . DILATION AND CURETTAGE OF UTERUS  1999  . JOINT REPLACEMENT    . RHINOPLASTY  2008  . TOTAL KNEE ARTHROPLASTY Left 09/17/2015  . TOTAL KNEE ARTHROPLASTY Left 09/17/2015   Procedure: TOTAL KNEE ARTHROPLASTY;  Surgeon: Loreta Ave, MD;  Location: [facility];  Service: Orthopedics;  Laterality: Left;  . TRIGGER FINGER RELEASE Left ~ 2003   thumb  . TUBAL LIGATION  1981  . VAGINAL HYSTERECTOMY  1999    There were no vitals filed for this visit.  Subjective Assessment - 05/20/17 1118    Subjective  Reports that her L shoulder bothered her last night and that is when she has the most and in a resting position. Patient reports that the injection on 05/10/2017 has really helped.    Pertinent  History  Arthritis.    Diagnostic tests  X-ray.  "I have a bone spur."    Patient Stated Goals  Use left UE without pain.    Currently in Pain?  No/denies Reports a little L cervical tightness         [facility] PT Assessment - 05/20/17 0001      Assessment   Medical Diagnosis  Left rotator shoulder impingement.    Next MD Visit  06/07/2017      Restrictions   Weight Bearing Restrictions  No                  [facility] Adult PT Treatment/Exercise - 05/20/17 0001      Exercises   Exercises  Shoulder      Shoulder Exercises: Seated   Flexion  AROM;Both;20 reps    Abduction  AROM;Both;20 reps      Shoulder Exercises: Standing   External Rotation  Strengthening;Left;20 reps;Theraband    Theraband Level (Shoulder External Rotation)  Level 1 (Yellow)    Internal Rotation  Strengthening;Left;20 reps;Theraband    Theraband Level (Shoulder Internal Rotation)  Level 1 (Yellow)    Extension  Strengthening;Left;20 reps;Theraband    Theraband Level (Shoulder Extension)  Level 1 (Yellow)    Row  Strengthening;Left;20 reps;Theraband    Theraband  Level (Shoulder Row)  Level 1 (Yellow)    Other Standing Exercises  LUE ranger flex, circles x20 reps each      Shoulder Exercises: ROM/Strengthening   UBE (Upper Arm Bike)  120 RPM x4 min (2 mn forward, 2 min backward)      Modalities   Modalities  Electrical Stimulation;Moist Heat      Moist Heat Therapy   Number Minutes Moist Heat  15 Minutes    Moist Heat Location  Shoulder      Electrical Stimulation   Electrical Stimulation Location  L shoulder    Electrical Stimulation Action  IFC    Electrical Stimulation Parameters  1-10 hz x15 min    Electrical Stimulation Goals  Pain             PT Education - 05/20/17 1204    Education provided  Yes    Education Details  HEP- resisted shoulder ER, IR, extension    Person(s) Educated  Patient    Methods  Explanation;Demonstration;Handout    Comprehension  Verbalized understanding           PT Long Term Goals - 05/16/17 1331      PT LONG TERM GOAL #1   Title  Ind with a HEP.    Time  6    Period  Weeks    Status  New      PT LONG TERM GOAL #2   Title  Right shoulder ER strength a solid 5/5.    Time  6    Period  Weeks    Status  New      PT LONG TERM GOAL #3   Title  Sleep 6 hours undisturbed.    Time  6    Period  Weeks    Status  New      PT LONG TERM GOAL #4   Title  Perform ADL's with pain not > 2/10.    Time  6    Period  Weeks    Status  New            Plan - 05/20/17 1154    Clinical Impression Statement  Patient presented in clinic with complaints of L shoulder pain mostly at night. Patient able to tolerate exercises as directed today with only minimal end range discomfort reported with L shoulder row and facial grimacing noted with ER as reps progressed. Normal modalities response noted following removal of the modalities. Patient provided light strengthening HEP with yellow theraband wiht education regarding HEP and parameters.    Rehab Potential  Excellent    PT Frequency  2x / week    PT Duration  6 weeks    PT Treatment/Interventions  ADLs/Self Care Home Management;Cryotherapy;Electrical Stimulation;Ultrasound;Moist Heat;Therapeutic activities;Therapeutic exercise;Patient/family education;Manual techniques;Dry needling    PT Next Visit Plan  Combo e'stim/U/S to left shoulder; STW/M to include IASTM; RW4; SDLY ER; full can.      Consulted and Agree with Plan of Care  Patient       Patient will benefit from skilled therapeutic intervention in order to improve the following deficits and impairments:  Decreased activity tolerance, Decreased strength, Postural dysfunction, Pain  Visit Diagnosis: Acute pain of left shoulder  Muscle weakness (generalized)     Problem List Patient Active Problem List   Diagnosis Date Noted  . S/P total knee replacement 09/17/2015    Marvell Fuller, PTA 05/20/2017, 12:25 PM  [facility] [address] [place], [place],  [address] Phone: [phone]   Fax:  [phone]  Name: Concha Seamela Langhorst MRN: [id number] Date of Birth: 09-23-1955

## 2017-05-23 ENCOUNTER — Ambulatory Visit: Payer: BLUE CROSS/BLUE SHIELD | Admitting: Physical Therapy

## 2017-05-23 ENCOUNTER — Encounter: Payer: Self-pay | Admitting: Physical Therapy

## 2017-05-23 DIAGNOSIS — M6281 Muscle weakness (generalized): Secondary | ICD-10-CM | POA: Diagnosis not present

## 2017-05-23 DIAGNOSIS — M25512 Pain in left shoulder: Secondary | ICD-10-CM | POA: Diagnosis not present

## 2017-05-23 NOTE — Therapy (Signed)
[facility] [address] [place], [place], [address] Phone: [phone]   Fax:  [phone]  Physical Therapy Treatment  Patient Details  Name: Mary Sims MRN: [id number] Date of Birth: 07-Jan-1956 Referring Provider: Lunette Stands MD   Encounter Date: 05/23/2017  PT End of Session - 05/23/17 1216    Visit Number  3    Number of Visits  12    Date for PT Re-Evaluation  06/27/17    Authorization Type  FOTO AT LEAST EVERY 5TH VISIT.    PT Start Time  1116    PT Stop Time  1211    PT Time Calculation (min)  55 min    Activity Tolerance  Patient tolerated treatment well    Behavior During Therapy  WFL for tasks assessed/performed       Past Medical History:  Diagnosis Date  . Anxiety   . Arthritis    "elbows, hands, knees, thumbs" (09/17/2015)  . Chronic bronchitis (HCC)   . Complication of anesthesia    slow to wake up after rhinoplasty  . Frequent UTI    "1-2 times/year; every year" (09/17/2015)  . GERD (gastroesophageal reflux disease)   . PONV (postoperative nausea and vomiting)   . Walking pneumonia 2004    Past Surgical History:  Procedure Laterality Date  . CATARACT EXTRACTION W/ INTRAOCULAR LENS  IMPLANT, BILATERAL Bilateral 2000s  . DILATION AND CURETTAGE OF UTERUS  1999  . JOINT REPLACEMENT    . RHINOPLASTY  2008  . TOTAL KNEE ARTHROPLASTY Left 09/17/2015  . TOTAL KNEE ARTHROPLASTY Left 09/17/2015   Procedure: TOTAL KNEE ARTHROPLASTY;  Surgeon: Loreta Ave, MD;  Location: [facility] OR;  Service: Orthopedics;  Laterality: Left;  . TRIGGER FINGER RELEASE Left ~ 2003   thumb  . TUBAL LIGATION  1981  . VAGINAL HYSTERECTOMY  1999    There were no vitals filed for this visit.  Subjective Assessment - 05/23/17 1226    Subjective  I'm doing those home exercises.  Doing pretty good today.    Pertinent History  Arthritis.    Pain Score  5     Pain Location  Shoulder    Pain Orientation  Left    Pain Descriptors /  Indicators  Aching;Throbbing    Pain Type  Acute pain    Pain Onset  More than a month ago                      [facility] Adult PT Treatment/Exercise - 05/23/17 0001      Exercises   Exercises  Shoulder      Shoulder Exercises: Standing   Other Standing Exercises  Yellow theraband IR and ER to fatigue with nimimal cues for technique.  [facility] ER with 2# to fatigue x 2.      Shoulder Exercises: ROM/Strengthening   UBE (Upper Arm Bike)  90 RPM's x 6 minutes ( forward and 3 minutes backward).      Modalities   Modalities  Ultrasound      Electrical Stimulation   Electrical Stimulation Location  -- Left shoulder.    Electrical Stimulation Action  IFC    Electrical Stimulation Parameters  80-150 Hz x 20 minutes.    Electrical Stimulation Goals  Pain      Ultrasound   Ultrasound Location  Left shoulder.    Ultrasound Parameters  Combo e'stim/U/S at 1.50 W/CM2 x 11 minutes.  PT Education - 05/23/17 1158    Education Details  HEP...SDLY ER.    Person(s) Educated  Patient    Methods  Explanation;Demonstration;Handout    Comprehension  Verbalized understanding;Returned demonstration          PT Long Term Goals - 05/16/17 1331      PT LONG TERM GOAL #1   Title  Ind with a HEP.    Time  6    Period  Weeks    Status  New      PT LONG TERM GOAL #2   Title  Right shoulder ER strength a solid 5/5.    Time  6    Period  Weeks    Status  New      PT LONG TERM GOAL #3   Title  Sleep 6 hours undisturbed.    Time  6    Period  Weeks    Status  New      PT LONG TERM GOAL #4   Title  Perform ADL's with pain not > 2/10.    Time  6    Period  Weeks    Status  New            Plan - 05/23/17 1241    Clinical Impression Statement  The patient did well today.  She was provided with red theraband for home use for IR but is to remain with yellow band for ER.  She was provided with a handout for [facility].  She did well with this exercise today.     PT Frequency  2x / week    PT Duration  6 weeks    PT Treatment/Interventions  ADLs/Self Care Home Management;Cryotherapy;Electrical Stimulation;Ultrasound;Moist Heat;Therapeutic activities;Therapeutic exercise;Patient/family education;Manual techniques;Dry needling    Consulted and Agree with Plan of Care  Patient       Patient will benefit from skilled therapeutic intervention in order to improve the following deficits and impairments:  Decreased activity tolerance, Decreased strength, Postural dysfunction, Pain  Visit Diagnosis: Acute pain of left shoulder  Muscle weakness (generalized)     Problem List Patient Active Problem List   Diagnosis Date Noted  . S/P total knee replacement 09/17/2015    Mary Sims, Mary Sims 05/23/2017, 12:44 PM  [facility] [address] [place], [place], [id number] Phone: [phone]   Fax:  [phone]  Name: Mary Seamela Sims MRN: [id number] Date of Birth: 1955-07-10

## 2017-05-25 ENCOUNTER — Ambulatory Visit: Payer: BLUE CROSS/BLUE SHIELD | Admitting: Physical Therapy

## 2017-05-25 ENCOUNTER — Encounter: Payer: Self-pay | Admitting: Physical Therapy

## 2017-05-25 DIAGNOSIS — M6281 Muscle weakness (generalized): Secondary | ICD-10-CM

## 2017-05-25 DIAGNOSIS — M25512 Pain in left shoulder: Secondary | ICD-10-CM | POA: Diagnosis not present

## 2017-05-25 NOTE — Therapy (Signed)
[facility] [address] [place], [place], [address] Phone: [phone]   Fax:  [phone]  Physical Therapy Treatment  Patient Details  Name: Mary Sims MRN: [id number] Date of Birth: 1955-04-23 Referring Provider: Lunette StandsAnna Voytek MD   Encounter Date: 05/25/2017  PT End of Session - 05/25/17 1151    Visit Number  4    Number of Visits  12    Date for PT Re-Evaluation  06/27/17    Authorization Type  FOTO AT LEAST EVERY 5TH VISIT.    PT Start Time  1115    PT Stop Time  1200    PT Time Calculation (min)  45 min    Activity Tolerance  Patient tolerated treatment well    Behavior During Therapy  WFL for tasks assessed/performed       Past Medical History:  Diagnosis Date  . Anxiety   . Arthritis    "elbows, hands, knees, thumbs" (09/17/2015)  . Chronic bronchitis (HCC)   . Complication of anesthesia    slow to wake up after rhinoplasty  . Frequent UTI    "1-2 times/year; every year" (09/17/2015)  . GERD (gastroesophageal reflux disease)   . PONV (postoperative nausea and vomiting)   . Walking pneumonia 2004    Past Surgical History:  Procedure Laterality Date  . CATARACT EXTRACTION W/ INTRAOCULAR LENS  IMPLANT, BILATERAL Bilateral 2000s  . DILATION AND CURETTAGE OF UTERUS  1999  . JOINT REPLACEMENT    . RHINOPLASTY  2008  . TOTAL KNEE ARTHROPLASTY Left 09/17/2015  . TOTAL KNEE ARTHROPLASTY Left 09/17/2015   Procedure: TOTAL KNEE ARTHROPLASTY;  Surgeon: Loreta Aveaniel F Murphy, MD;  [facility];  Service: Orthopedics;  Laterality: Left;  . TRIGGER FINGER RELEASE Left ~ 2003   thumb  . TUBAL LIGATION  1981  . VAGINAL HYSTERECTOMY  1999    There were no vitals filed for this visit.  Subjective Assessment - 05/25/17 1130    Subjective  Patient reported ongoing progress    Pertinent History  Arthritis.    Diagnostic tests  X-ray.  "I have a bone spur."    Patient Stated Goals  Use left UE without pain.    Currently in Pain?   Yes    Pain Score  3     Pain Location  Shoulder    Pain Orientation  Left    Pain Descriptors / Indicators  Discomfort;Sore    Pain Type  Acute pain    Pain Onset  More than a month ago    Pain Frequency  Intermittent    Aggravating Factors   at night    Pain Relieving Factors  use of arm                      [facility] Adult PT Treatment/Exercise - 05/25/17 0001      Shoulder Exercises: Standing   Protraction  Strengthening;Left;Theraband;20 reps;10 reps    Theraband Level (Shoulder Protraction)  Level 2 (Red)    External Rotation  Strengthening;Left;20 reps;Theraband;10 reps    Theraband Level (Shoulder External Rotation)  Level 2 (Red)    Internal Rotation  Strengthening;Left;20 reps;Theraband;10 reps    Theraband Level (Shoulder Internal Rotation)  Level 1 (Yellow)    Row  Strengthening;Left;20 reps;Theraband;10 reps    Theraband Level (Shoulder Row)  Level 2 (Red)    Other Standing Exercises  standing ER with UE support 2# x30      Shoulder Exercises: Pulleys   Other  Pulley Exercises  UE ranger 3x10 for elevation      Shoulder Exercises: ROM/Strengthening   UBE (Upper Arm Bike)  90 RPM's x 6 minutes ( forward and 3 minutes backward).      Moist Heat Therapy   Number Minutes Moist Heat  10 Minutes    Moist Heat Location  Shoulder      Electrical Stimulation   Electrical Stimulation Location  L shoulder    Electrical Stimulation Action  IFC    Electrical Stimulation Parameters  80-150hz x29min    Electrical Stimulation Goals  Pain                  PT Long Term Goals - 05/25/17 1153      PT LONG TERM GOAL #1   Title  Ind with a HEP.    Time  6    Period  Weeks    Status  On-going      PT LONG TERM GOAL #2   Title  Right shoulder ER strength a solid 5/5.    Time  6    Period  Weeks    Status  On-going      PT LONG TERM GOAL #3   Title  Sleep 6 hours undisturbed.    Time  6    Period  Weeks    Status  On-going      PT LONG TERM  GOAL #4   Title  Perform ADL's with pain not > 2/10.    Time  6    Period  Weeks    Status  On-going            Plan - 05/25/17 1153    Clinical Impression Statement  Patient tolerated treatment well today. Patent progressing with all left shouder activities today. Today reviwed HEP/ER with t-band and required educational cues for correct technique. Patient has ongoing reported soreness esp at night and relief when using arm during the day. Current goals progressing yet ongoing due to pain and strength deficts.     Rehab Potential  Excellent    PT Frequency  2x / week    PT Duration  6 weeks    PT Treatment/Interventions  ADLs/Self Care Home Management;Cryotherapy;Electrical Stimulation;Ultrasound;Moist Heat;Therapeutic activities;Therapeutic exercise;Patient/family education;Manual techniques;Dry needling    PT Next Visit Plan  cont with POC    Consulted and Agree with Plan of Care  Patient       Patient will benefit from skilled therapeutic intervention in order to improve the following deficits and impairments:  Decreased activity tolerance, Decreased strength, Postural dysfunction, Pain  Visit Diagnosis: Acute pain of left shoulder  Muscle weakness (generalized)     Problem List Patient Active Problem List   Diagnosis Date Noted  . S/P total knee replacement 09/17/2015    Tytan Sandate P, PTA 05/25/2017, 12:04 PM  [facility] [address] [place], [place], [address] Phone: [phone]   Fax:  [phone]  Name: Mary Sims MRN: [id number] Date of Birth: 04/15/1955

## 2017-05-30 ENCOUNTER — Ambulatory Visit: Payer: BLUE CROSS/BLUE SHIELD | Attending: Orthopedic Surgery | Admitting: Physical Therapy

## 2017-05-30 ENCOUNTER — Encounter: Payer: Self-pay | Admitting: Physical Therapy

## 2017-05-30 DIAGNOSIS — M6281 Muscle weakness (generalized): Secondary | ICD-10-CM | POA: Insufficient documentation

## 2017-05-30 DIAGNOSIS — M25512 Pain in left shoulder: Secondary | ICD-10-CM | POA: Insufficient documentation

## 2017-05-30 NOTE — Therapy (Signed)
[facility] [address] [place], [place], [address] Phone: [phone]   Fax:  [phone]  Physical Therapy Treatment  Patient Details  Name: Jaquesha Boroff MRN: [id number] Date of Birth: Aug 21, 1955 Referring Provider: Lunette Stands MD   Encounter Date: 05/30/2017  PT End of Session - 05/30/17 1125    Visit Number  5    Number of Visits  12    Date for PT Re-Evaluation  06/27/17    Authorization Type  FOTO AT LEAST EVERY 5TH VISIT.    PT Start Time  1116    PT Stop Time  1208    PT Time Calculation (min)  52 min    Activity Tolerance  Patient tolerated treatment well    Behavior During Therapy  WFL for tasks assessed/performed       Past Medical History:  Diagnosis Date  . Anxiety   . Arthritis    "elbows, hands, knees, thumbs" (09/17/2015)  . Chronic bronchitis (HCC)   . Complication of anesthesia    slow to wake up after rhinoplasty  . Frequent UTI    "1-2 times/year; every year" (09/17/2015)  . GERD (gastroesophageal reflux disease)   . PONV (postoperative nausea and vomiting)   . Walking pneumonia 2004    Past Surgical History:  Procedure Laterality Date  . CATARACT EXTRACTION W/ INTRAOCULAR LENS  IMPLANT, BILATERAL Bilateral 2000s  . DILATION AND CURETTAGE OF UTERUS  1999  . JOINT REPLACEMENT    . RHINOPLASTY  2008  . TOTAL KNEE ARTHROPLASTY Left 09/17/2015  . TOTAL KNEE ARTHROPLASTY Left 09/17/2015   Procedure: TOTAL KNEE ARTHROPLASTY;  Surgeon: Loreta Ave, MD;  Location: [facility] OR;  Service: Orthopedics;  Laterality: Left;  . TRIGGER FINGER RELEASE Left ~ 2003   thumb  . TUBAL LIGATION  1981  . VAGINAL HYSTERECTOMY  1999    There were no vitals filed for this visit.  Subjective Assessment - 05/30/17 1123    Subjective  Reports more discomfort in L UT region today but reports shoulder is better.    Pertinent History  Arthritis.    Diagnostic tests  X-ray.  "I have a bone spur."    Patient Stated Goals  Use  left UE without pain.    Currently in Pain?  Yes    Pain Score  3     Pain Location  Shoulder    Pain Orientation  Left;Posterior    Pain Descriptors / Indicators  Tiring;Dull    Pain Type  Acute pain    Pain Onset  More than a month ago         [facility] PT Assessment - 05/30/17 0001      Assessment   Medical Diagnosis  Left rotator shoulder impingement.    Next MD Visit  06/07/2017      Restrictions   Weight Bearing Restrictions  No                  [facility] Adult PT Treatment/Exercise - 05/30/17 0001      Shoulder Exercises: Standing   Protraction  Strengthening;Left;Theraband;20 reps;10 reps    Theraband Level (Shoulder Protraction)  Level 1 (Yellow)    External Rotation  Strengthening;Left;20 reps;Theraband;10 reps    Theraband Level (Shoulder External Rotation)  Level 1 (Yellow)    Internal Rotation  Strengthening;Left;20 reps;Theraband;10 reps    Theraband Level (Shoulder Internal Rotation)  Level 1 (Yellow)    Extension  Strengthening;Left;20 reps;Theraband    Theraband Level (Shoulder Extension)  Level 1 (Yellow)      Shoulder Exercises: Pulleys   Other Pulley Exercises  UE ranger 3x10 for elevation    Other Pulley Exercises  Wall slides with ER x20 reps      Shoulder Exercises: ROM/Strengthening   UBE (Upper Arm Bike)  90 RPM's x 6 minutes (3mins forward and 3 minutes backward).    Wall Wash  X20 reps CW and CCW    Wall Pushups  20 reps      Modalities   Modalities  Electrical Stimulation;Moist Heat      Moist Heat Therapy   Number Minutes Moist Heat  15 Minutes    Moist Heat Location  Shoulder      Electrical Stimulation   Electrical Stimulation Location  L shoulder/ UT    Electrical Stimulation Action  PreMod    Electrical Stimulation Parameters  80-150 hz x15 min    Electrical Stimulation Goals  Pain;Tone      Manual Therapy   Manual Therapy  Soft tissue mobilization    Soft tissue mobilization  STW/MFR to L UT, levator scapula to reduce pain  and tone                  PT Long Term Goals - 05/25/17 1153      PT LONG TERM GOAL #1   Title  Ind with a HEP.    Time  6    Period  Weeks    Status  On-going      PT LONG TERM GOAL #2   Title  Right shoulder ER strength a solid 5/5.    Time  6    Period  Weeks    Status  On-going      PT LONG TERM GOAL #3   Title  Sleep 6 hours undisturbed.    Time  6    Period  Weeks    Status  On-going      PT LONG TERM GOAL #4   Title  Perform ADL's with pain not > 2/10.    Time  6    Period  Weeks    Status  On-going            Plan - 05/30/17 1215    Clinical Impression Statement  Patient presented in clinic with reports of discomfort more in L UT region today but may be from lifting water at grocery store. Patient guided through shoulder strengthening exercises with focus on proper technique especially with RW4 exercises with light resistance to ensure proper technique. Patient very sensitive to manual therapy along mid L UT and superior angle of the L scapula region. Normal modalites response noted following removal of the modalities.    Rehab Potential  Excellent    PT Frequency  2x / week    PT Duration  6 weeks    PT Treatment/Interventions  ADLs/Self Care Home Management;Cryotherapy;Electrical Stimulation;Ultrasound;Moist Heat;Therapeutic activities;Therapeutic exercise;Patient/family education;Manual techniques;Dry needling    PT Next Visit Plan  cont with POC    Consulted and Agree with Plan of Care  Patient       Patient will benefit from skilled therapeutic intervention in order to improve the following deficits and impairments:  Decreased activity tolerance, Decreased strength, Postural dysfunction, Pain  Visit Diagnosis: Acute pain of left shoulder  Muscle weakness (generalized)     Problem List Patient Active Problem List   Diagnosis Date Noted  . S/P total knee replacement 09/17/2015    Marvell FullerKelsey P Kennon, PTA 05/30/2017,  12:18 PM  [facility] [address] [place], [place], [address] Phone: [phone]   Fax:  [phone]  Name: Hajira Verhagen MRN: [id number] Date of Birth: 02-26-1956

## 2017-06-01 ENCOUNTER — Ambulatory Visit: Payer: BLUE CROSS/BLUE SHIELD | Admitting: Physical Therapy

## 2017-06-01 ENCOUNTER — Encounter: Payer: Self-pay | Admitting: Physical Therapy

## 2017-06-01 DIAGNOSIS — M25512 Pain in left shoulder: Secondary | ICD-10-CM

## 2017-06-01 DIAGNOSIS — M6281 Muscle weakness (generalized): Secondary | ICD-10-CM

## 2017-06-01 NOTE — Therapy (Addendum)
[facility] [place], [place], [address] Phone: [phone]   Fax:  [phone]  Physical Therapy Treatment  Patient Details  Name: Mary Sims MRN: [id number] Date of Birth: 11-16-1955 Referring Provider: Almedia Balls MD   Encounter Date: 06/01/2017  PT End of Session - 06/01/17 1122    Visit Number  6    Number of Visits  12    Date for PT Re-Evaluation  06/27/17    Authorization Type  FOTO AT LEAST EVERY 5TH VISIT.    PT Start Time  1117    PT Stop Time  1204    PT Time Calculation (min)  47 min    Activity Tolerance  Patient tolerated treatment well    Behavior During Therapy  WFL for tasks assessed/performed       Past Medical History:  Diagnosis Date  . Anxiety   . Arthritis    "elbows, hands, knees, thumbs" (09/17/2015)  . Chronic bronchitis ([place])   . Complication of anesthesia    slow to wake up after rhinoplasty  . Frequent UTI    "1-2 times/year; every year" (09/17/2015)  . GERD (gastroesophageal reflux disease)   . PONV (postoperative nausea and vomiting)   . Walking pneumonia 2004    Past Surgical History:  Procedure Laterality Date  . CATARACT EXTRACTION W/ INTRAOCULAR LENS  IMPLANT, BILATERAL Bilateral 2000s  . DILATION AND CURETTAGE OF UTERUS  1999  . JOINT REPLACEMENT    . RHINOPLASTY  2008  . TOTAL KNEE ARTHROPLASTY Left 09/17/2015  . TOTAL KNEE ARTHROPLASTY Left 09/17/2015   Procedure: TOTAL KNEE ARTHROPLASTY;  Surgeon: Ninetta Lights, MD;  Location: [facility];  Service: Orthopedics;  Laterality: Left;  . TRIGGER FINGER RELEASE Left ~ 2003   thumb  . TUBAL LIGATION  1981  . VAGINAL HYSTERECTOMY  1999    There were no vitals filed for this visit.  Subjective Assessment - 06/01/17 1121    Subjective  Patien arrived and reported little soreness overall    Pertinent History  Arthritis.    Diagnostic tests  X-ray.  "I have a bone spur."    Patient Stated Goals  Use left UE without pain.    Currently in Pain?  Yes    Pain Score  1     Pain Location  Shoulder    Pain Orientation  Left    Pain Descriptors / Indicators  Sore    Pain Type  Acute pain    Pain Onset  More than a month ago    Pain Frequency  Intermittent    Aggravating Factors   at night     Pain Relieving Factors  movement and use                      [facility] Adult PT Treatment/Exercise - 06/01/17 0001      Shoulder Exercises: Standing   Protraction  Strengthening;Left;Theraband;20 reps;10 reps    Theraband Level (Shoulder Protraction)  Level 1 (Yellow)    External Rotation  Strengthening;Left;20 reps;Theraband;10 reps    Theraband Level (Shoulder External Rotation)  Level 1 (Yellow)    Internal Rotation  Strengthening;Left;20 reps;Theraband;10 reps    Theraband Level (Shoulder Internal Rotation)  Level 1 (Yellow)    Extension  Strengthening;Left;20 reps;Theraband;10 reps    Theraband Level (Shoulder Extension)  Level 1 (Yellow)      Shoulder Exercises: ROM/Strengthening   UBE (Upper Arm Bike)  90 RPM's x 6 minutes (49mns forward  and 3 minutes backward).    Wall Wash  X20 reps CW and CCW    Wall Pushups  20 reps    Other ROM/Strengthening Exercises  red t-band for bil resistance with overhead and lower 2x10      Moist Heat Therapy   Number Minutes Moist Heat  15 Minutes    Moist Heat Location  Shoulder      Electrical Stimulation   Electrical Stimulation Location  L shoulder/ UT    Electrical Stimulation Action  premod    Electrical Stimulation Parameters  80-150hz  x35mn    Electrical Stimulation Goals  Pain;Tone                  PT Long Term Goals - 05/25/17 1153      PT LONG TERM GOAL #1   Title  Ind with a HEP.    Time  6    Period  Weeks    Status  On-going      PT LONG TERM GOAL #2   Title  Right shoulder ER strength a solid 5/5.    Time  6    Period  Weeks    Status  On-going      PT LONG TERM GOAL #3   Title  Sleep 6 hours undisturbed.    Time  6     Period  Weeks    Status  On-going      PT LONG TERM GOAL #4   Title  Perform ADL's with pain not > 2/10.    Time  6    Period  Weeks    Status  On-going            Plan - [id number]    Clinical Impression Statement  Patient tolerated treatment well today and able to progress exercises today for left shoulder strengthening. Patint FOTO score 21% limitation (initial 61%). Goals progressing with some limitations with sleeping on left side and with certain movements.     Rehab Potential  Excellent    Clinical Impairments Affecting Rehab Potential  6th visit FOTO 21% (initial 61%)    PT Frequency  2x / week    PT Duration  6 weeks    PT Treatment/Interventions  ADLs/Self Care Home Management;Cryotherapy;Electrical Stimulation;Ultrasound;Moist Heat;Therapeutic activities;Therapeutic exercise;Patient/family education;Manual techniques;Dry needling    PT Next Visit Plan  cont with POC    Consulted and Agree with Plan of Care  Patient       Patient will benefit from skilled therapeutic intervention in order to improve the following deficits and impairments:  Decreased activity tolerance, Decreased strength, Postural dysfunction, Pain  Visit Diagnosis: Acute pain of left shoulder  Muscle weakness (generalized)   G-Codes - 0March 07, 2019102/27/1207   Functional Assessment Tool Used (Outpatient Only)  6th visit FOTO 21% limitation       Problem List Patient Active Problem List   Diagnosis Date Noted  . S/P total knee replacement 09/17/2015    Mary Sims P, PTA 303/07/19 12:09 PM  [facility] [address]Phone: [phone]  Fax:  [phone] Name: PDamyra LuscherMRN: [id number] of Birth: 7Jun 07, 1957 PHYSICAL THERAPY DISCHARGE SUMMARY  Visits from Start of Care: 6.  Current functional level related to goals / functional outcomes: See above.   Remaining deficits: See below.   Education  / Equipment: HEP. Plan: Patient agrees to discharge.  Patient goals were not met. Patient is being discharged due  to not returning since the last visit.  ?????         Mali Applegate MPT

## 2017-06-06 ENCOUNTER — Ambulatory Visit: Payer: BLUE CROSS/BLUE SHIELD | Admitting: *Deleted

## 2017-06-06 DIAGNOSIS — J019 Acute sinusitis, unspecified: Secondary | ICD-10-CM | POA: Diagnosis not present

## 2017-06-06 DIAGNOSIS — R509 Fever, unspecified: Secondary | ICD-10-CM | POA: Diagnosis not present

## 2017-06-06 DIAGNOSIS — J209 Acute bronchitis, unspecified: Secondary | ICD-10-CM | POA: Diagnosis not present

## 2017-06-06 DIAGNOSIS — J029 Acute pharyngitis, unspecified: Secondary | ICD-10-CM | POA: Diagnosis not present

## 2017-06-07 DIAGNOSIS — M542 Cervicalgia: Secondary | ICD-10-CM | POA: Diagnosis not present

## 2017-06-08 ENCOUNTER — Encounter: Payer: BLUE CROSS/BLUE SHIELD | Admitting: Physical Therapy

## 2017-06-09 DIAGNOSIS — R05 Cough: Secondary | ICD-10-CM | POA: Diagnosis not present

## 2017-06-09 DIAGNOSIS — J019 Acute sinusitis, unspecified: Secondary | ICD-10-CM | POA: Diagnosis not present

## 2017-06-09 DIAGNOSIS — J09X2 Influenza due to identified novel influenza A virus with other respiratory manifestations: Secondary | ICD-10-CM | POA: Diagnosis not present

## 2017-06-09 DIAGNOSIS — J029 Acute pharyngitis, unspecified: Secondary | ICD-10-CM | POA: Diagnosis not present

## 2018-01-03 DIAGNOSIS — M25532 Pain in left wrist: Secondary | ICD-10-CM | POA: Diagnosis not present

## 2018-01-03 DIAGNOSIS — M25531 Pain in right wrist: Secondary | ICD-10-CM | POA: Diagnosis not present

## 2018-01-03 DIAGNOSIS — Z23 Encounter for immunization: Secondary | ICD-10-CM | POA: Diagnosis not present

## 2018-01-24 DIAGNOSIS — M180 Bilateral primary osteoarthritis of first carpometacarpal joints: Secondary | ICD-10-CM | POA: Diagnosis not present

## 2018-02-02 DIAGNOSIS — J209 Acute bronchitis, unspecified: Secondary | ICD-10-CM | POA: Diagnosis not present

## 2018-02-02 DIAGNOSIS — J029 Acute pharyngitis, unspecified: Secondary | ICD-10-CM | POA: Diagnosis not present

## 2018-02-02 DIAGNOSIS — R030 Elevated blood-pressure reading, without diagnosis of hypertension: Secondary | ICD-10-CM | POA: Diagnosis not present

## 2018-02-02 DIAGNOSIS — J019 Acute sinusitis, unspecified: Secondary | ICD-10-CM | POA: Diagnosis not present

## 2018-02-14 DIAGNOSIS — K219 Gastro-esophageal reflux disease without esophagitis: Secondary | ICD-10-CM | POA: Diagnosis not present

## 2018-02-14 DIAGNOSIS — M19041 Primary osteoarthritis, right hand: Secondary | ICD-10-CM | POA: Diagnosis not present

## 2018-02-14 DIAGNOSIS — F411 Generalized anxiety disorder: Secondary | ICD-10-CM | POA: Diagnosis not present

## 2018-02-14 DIAGNOSIS — I10 Essential (primary) hypertension: Secondary | ICD-10-CM | POA: Diagnosis not present

## 2018-08-08 DIAGNOSIS — K219 Gastro-esophageal reflux disease without esophagitis: Secondary | ICD-10-CM | POA: Diagnosis not present

## 2018-08-08 DIAGNOSIS — I10 Essential (primary) hypertension: Secondary | ICD-10-CM | POA: Diagnosis not present

## 2018-08-08 DIAGNOSIS — M19041 Primary osteoarthritis, right hand: Secondary | ICD-10-CM | POA: Diagnosis not present

## 2018-08-08 DIAGNOSIS — F411 Generalized anxiety disorder: Secondary | ICD-10-CM | POA: Diagnosis not present

## 2018-08-10 DIAGNOSIS — M1811 Unilateral primary osteoarthritis of first carpometacarpal joint, right hand: Secondary | ICD-10-CM | POA: Diagnosis not present

## 2018-08-10 DIAGNOSIS — M1812 Unilateral primary osteoarthritis of first carpometacarpal joint, left hand: Secondary | ICD-10-CM | POA: Diagnosis not present

## 2018-10-10 IMAGING — DX DG HIP (WITH OR WITHOUT PELVIS) 3-4V BILAT
5 series · 5 of 5 positions shown · non-contrast
Comparison: None.

CLINICAL DATA: Lateral hip pain

EXAM:
DG HIP (WITH OR WITHOUT PELVIS) 4V BILAT

[pelvis ap]
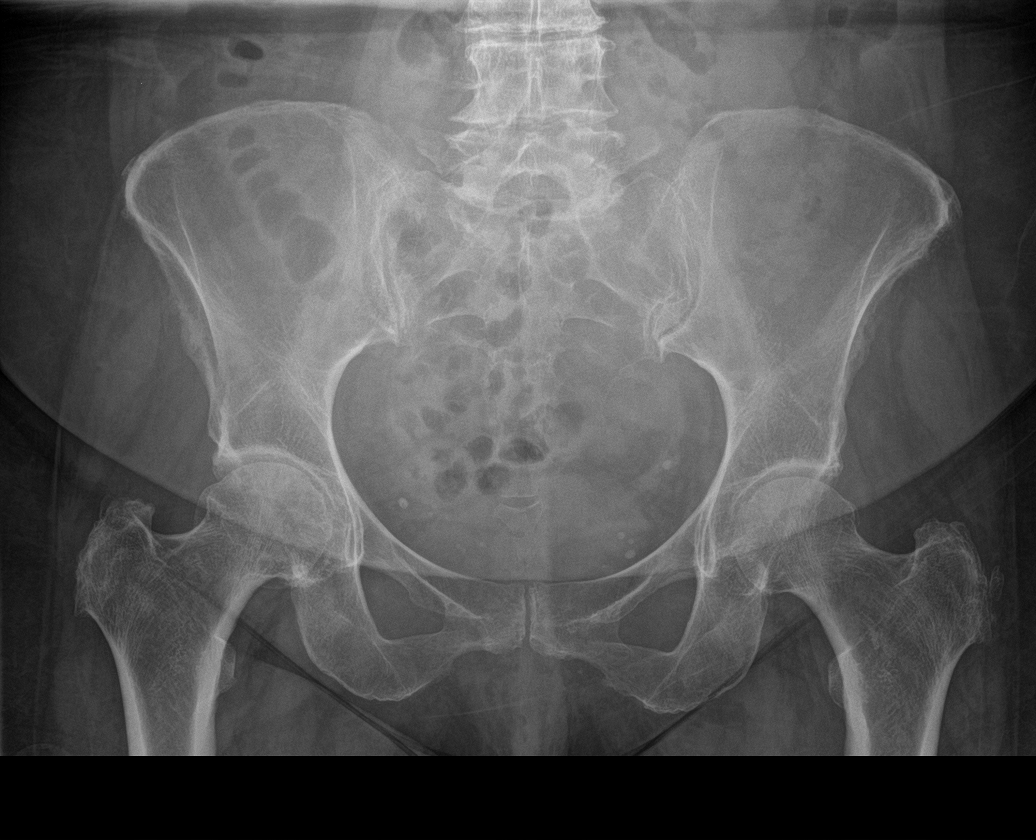

[hip ap (1 of 2)]
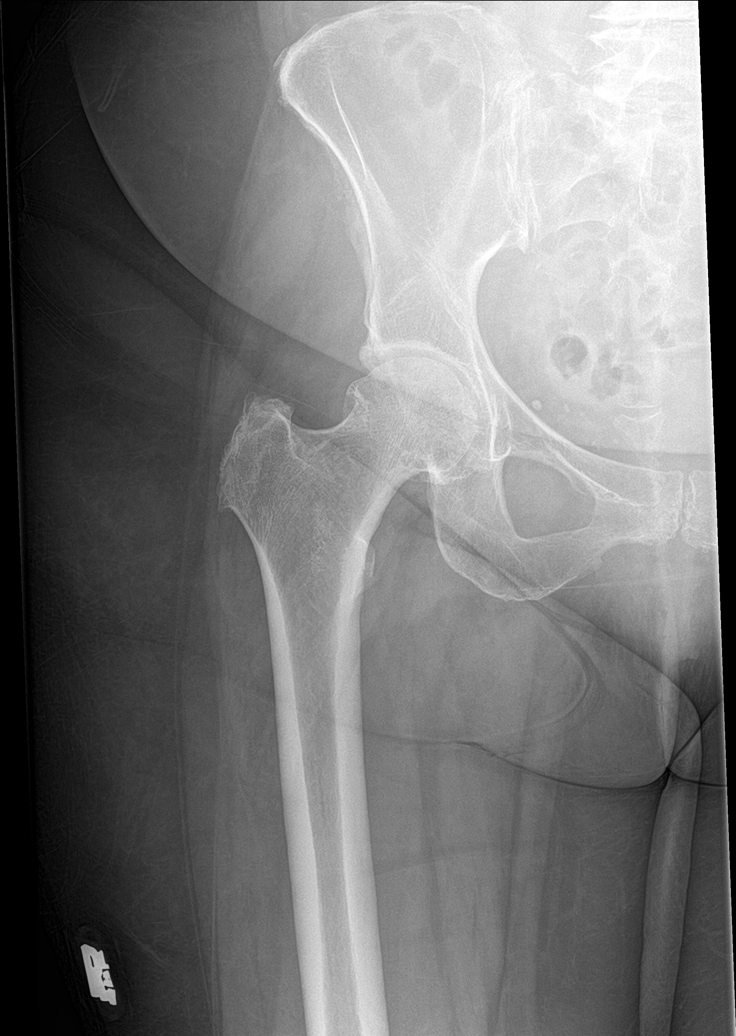

[hip lat (1 of 2)]
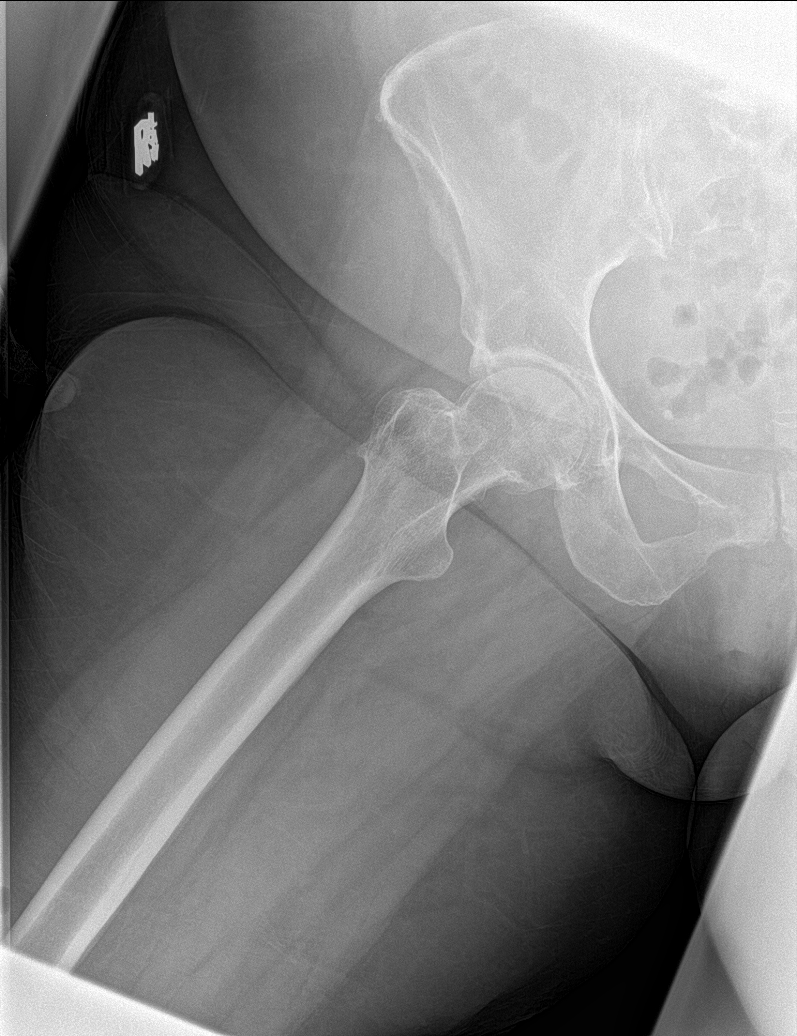

[hip ap (2 of 2)]
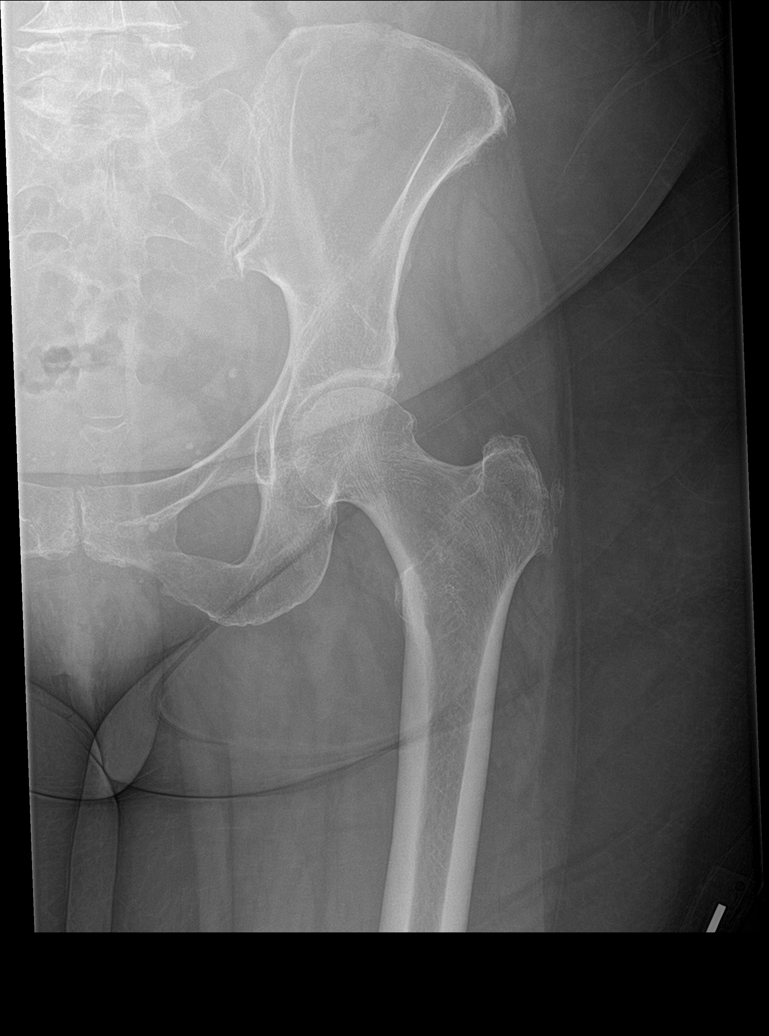

[hip lat (2 of 2)]
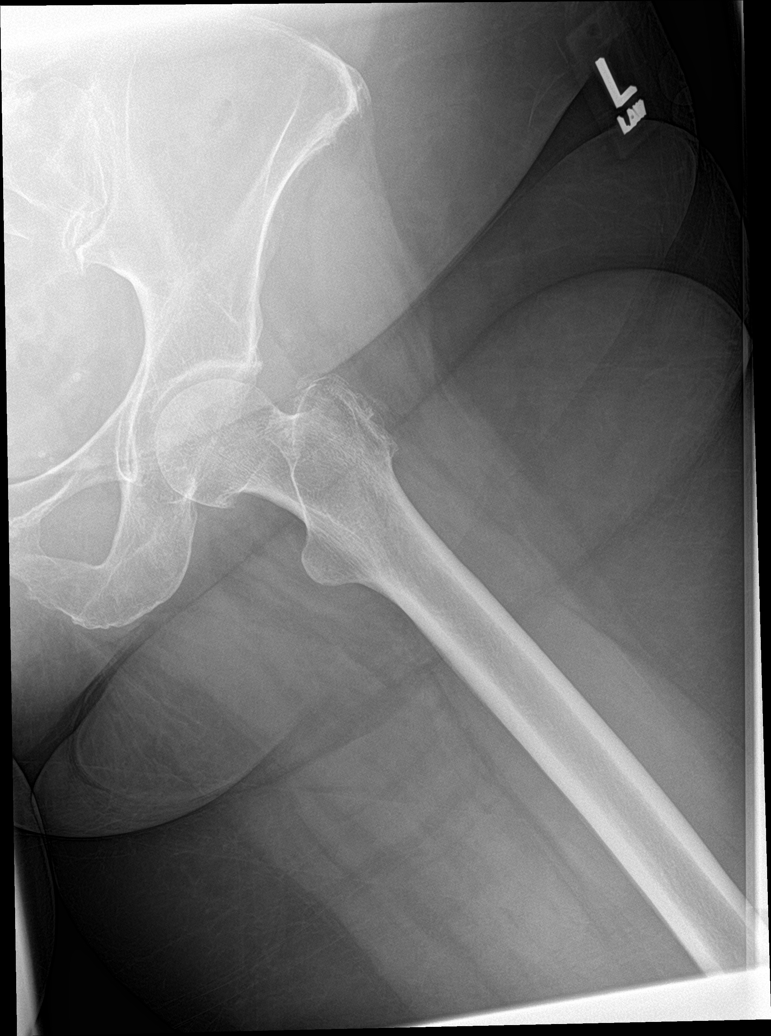

[5 of 5 positions shown; findings below may reference images not displayed]

FINDINGS: Pelvic ring is intact. Mild degenerative changes of the hip joints
are noted bilaterally. No acute fracture or dislocation is seen. No
soft tissue abnormality is noted.
IMPRESSION: Degenerative change without acute abnormality.

## 2018-12-06 DIAGNOSIS — M19079 Primary osteoarthritis, unspecified ankle and foot: Secondary | ICD-10-CM | POA: Diagnosis not present

## 2018-12-06 DIAGNOSIS — I10 Essential (primary) hypertension: Secondary | ICD-10-CM | POA: Diagnosis not present

## 2018-12-06 DIAGNOSIS — Z6834 Body mass index (BMI) 34.0-34.9, adult: Secondary | ICD-10-CM | POA: Diagnosis not present

## 2018-12-06 DIAGNOSIS — Z23 Encounter for immunization: Secondary | ICD-10-CM | POA: Diagnosis not present

## 2019-02-02 DIAGNOSIS — Z20828 Contact with and (suspected) exposure to other viral communicable diseases: Secondary | ICD-10-CM | POA: Diagnosis not present

## 2019-02-02 DIAGNOSIS — R030 Elevated blood-pressure reading, without diagnosis of hypertension: Secondary | ICD-10-CM | POA: Diagnosis not present

## 2019-04-18 DIAGNOSIS — Z20828 Contact with and (suspected) exposure to other viral communicable diseases: Secondary | ICD-10-CM | POA: Diagnosis not present

## 2019-04-18 DIAGNOSIS — R5383 Other fatigue: Secondary | ICD-10-CM | POA: Diagnosis not present

## 2019-05-22 DIAGNOSIS — M180 Bilateral primary osteoarthritis of first carpometacarpal joints: Secondary | ICD-10-CM | POA: Diagnosis not present

## 2019-07-25 DIAGNOSIS — Z23 Encounter for immunization: Secondary | ICD-10-CM | POA: Diagnosis not present

## 2019-09-03 DIAGNOSIS — Z23 Encounter for immunization: Secondary | ICD-10-CM | POA: Diagnosis not present

## 2019-09-05 DIAGNOSIS — I10 Essential (primary) hypertension: Secondary | ICD-10-CM | POA: Diagnosis not present

## 2019-09-05 DIAGNOSIS — Z6834 Body mass index (BMI) 34.0-34.9, adult: Secondary | ICD-10-CM | POA: Diagnosis not present

## 2019-09-05 DIAGNOSIS — M7061 Trochanteric bursitis, right hip: Secondary | ICD-10-CM | POA: Diagnosis not present

## 2019-09-05 DIAGNOSIS — M1990 Unspecified osteoarthritis, unspecified site: Secondary | ICD-10-CM | POA: Diagnosis not present

## 2019-12-25 DIAGNOSIS — M180 Bilateral primary osteoarthritis of first carpometacarpal joints: Secondary | ICD-10-CM | POA: Diagnosis not present

## 2020-01-09 DIAGNOSIS — J209 Acute bronchitis, unspecified: Secondary | ICD-10-CM | POA: Diagnosis not present

## 2020-01-14 DIAGNOSIS — Z20828 Contact with and (suspected) exposure to other viral communicable diseases: Secondary | ICD-10-CM | POA: Diagnosis not present

## 2020-01-14 DIAGNOSIS — J40 Bronchitis, not specified as acute or chronic: Secondary | ICD-10-CM | POA: Diagnosis not present

## 2020-02-14 DIAGNOSIS — R0781 Pleurodynia: Secondary | ICD-10-CM | POA: Diagnosis not present

## 2020-02-14 DIAGNOSIS — Z6834 Body mass index (BMI) 34.0-34.9, adult: Secondary | ICD-10-CM | POA: Diagnosis not present

## 2020-02-22 DIAGNOSIS — K219 Gastro-esophageal reflux disease without esophagitis: Secondary | ICD-10-CM | POA: Diagnosis not present

## 2020-02-22 DIAGNOSIS — I10 Essential (primary) hypertension: Secondary | ICD-10-CM | POA: Diagnosis not present

## 2020-02-25 DIAGNOSIS — I2510 Atherosclerotic heart disease of native coronary artery without angina pectoris: Secondary | ICD-10-CM | POA: Diagnosis not present

## 2020-02-25 DIAGNOSIS — R918 Other nonspecific abnormal finding of lung field: Secondary | ICD-10-CM | POA: Diagnosis not present

## 2020-02-25 DIAGNOSIS — R0781 Pleurodynia: Secondary | ICD-10-CM | POA: Diagnosis not present

## 2020-02-25 DIAGNOSIS — K449 Diaphragmatic hernia without obstruction or gangrene: Secondary | ICD-10-CM | POA: Diagnosis not present

## 2020-02-25 DIAGNOSIS — M47814 Spondylosis without myelopathy or radiculopathy, thoracic region: Secondary | ICD-10-CM | POA: Diagnosis not present

## 2020-02-25 DIAGNOSIS — R222 Localized swelling, mass and lump, trunk: Secondary | ICD-10-CM | POA: Diagnosis not present

## 2020-02-26 DIAGNOSIS — R5383 Other fatigue: Secondary | ICD-10-CM | POA: Diagnosis not present

## 2020-02-26 DIAGNOSIS — I10 Essential (primary) hypertension: Secondary | ICD-10-CM | POA: Diagnosis not present

## 2020-02-26 DIAGNOSIS — Z13220 Encounter for screening for lipoid disorders: Secondary | ICD-10-CM | POA: Diagnosis not present

## 2020-02-26 DIAGNOSIS — K219 Gastro-esophageal reflux disease without esophagitis: Secondary | ICD-10-CM | POA: Diagnosis not present

## 2020-03-04 DIAGNOSIS — I10 Essential (primary) hypertension: Secondary | ICD-10-CM | POA: Diagnosis not present

## 2020-03-04 DIAGNOSIS — K219 Gastro-esophageal reflux disease without esophagitis: Secondary | ICD-10-CM | POA: Diagnosis not present

## 2020-03-04 DIAGNOSIS — E782 Mixed hyperlipidemia: Secondary | ICD-10-CM | POA: Diagnosis not present

## 2020-03-04 DIAGNOSIS — F411 Generalized anxiety disorder: Secondary | ICD-10-CM | POA: Diagnosis not present

## 2020-03-10 DIAGNOSIS — Z23 Encounter for immunization: Secondary | ICD-10-CM | POA: Diagnosis not present

## 2021-04-28 ENCOUNTER — Other Ambulatory Visit: Payer: Self-pay | Admitting: Dentistry

## 2021-04-28 DIAGNOSIS — S0300XA Dislocation of jaw, unspecified side, initial encounter: Secondary | ICD-10-CM

## 2021-09-17 ENCOUNTER — Other Ambulatory Visit: Payer: Self-pay | Admitting: Physical Medicine & Rehabilitation

## 2021-09-17 ENCOUNTER — Other Ambulatory Visit (HOSPITAL_BASED_OUTPATIENT_CLINIC_OR_DEPARTMENT_OTHER): Payer: Self-pay | Admitting: Physical Medicine & Rehabilitation

## 2021-09-17 DIAGNOSIS — S32110D Nondisplaced Zone I fracture of sacrum, subsequent encounter for fracture with routine healing: Secondary | ICD-10-CM

## 2021-09-22 ENCOUNTER — Ambulatory Visit (HOSPITAL_BASED_OUTPATIENT_CLINIC_OR_DEPARTMENT_OTHER): Payer: BLUE CROSS/BLUE SHIELD

## 2021-09-24 ENCOUNTER — Ambulatory Visit (HOSPITAL_COMMUNITY)
Admission: RE | Admit: 2021-09-24 | Discharge: 2021-09-24 | Disposition: A | Discharge status: Home / Self Care | Payer: BC Managed Care – PPO | Source: Ambulatory Visit | Attending: Physical Medicine & Rehabilitation | Admitting: Physical Medicine & Rehabilitation

## 2021-09-24 DIAGNOSIS — S32110D Nondisplaced Zone I fracture of sacrum, subsequent encounter for fracture with routine healing: Secondary | ICD-10-CM | POA: Diagnosis present

## 2022-03-29 HISTORY — PX: TOTAL HIP ARTHROPLASTY: SHX124

## 2022-04-27 DIAGNOSIS — M1611 Unilateral primary osteoarthritis, right hip: Secondary | ICD-10-CM | POA: Diagnosis not present

## 2022-05-12 DIAGNOSIS — M1611 Unilateral primary osteoarthritis, right hip: Secondary | ICD-10-CM | POA: Diagnosis not present

## 2022-07-20 DIAGNOSIS — M1611 Unilateral primary osteoarthritis, right hip: Secondary | ICD-10-CM | POA: Diagnosis not present

## 2022-07-28 DIAGNOSIS — M1611 Unilateral primary osteoarthritis, right hip: Secondary | ICD-10-CM | POA: Diagnosis not present

## 2022-08-03 DIAGNOSIS — M25551 Pain in right hip: Secondary | ICD-10-CM | POA: Diagnosis not present

## 2022-08-03 DIAGNOSIS — Z01812 Encounter for preprocedural laboratory examination: Secondary | ICD-10-CM | POA: Diagnosis not present

## 2022-08-03 DIAGNOSIS — M1611 Unilateral primary osteoarthritis, right hip: Secondary | ICD-10-CM | POA: Diagnosis not present

## 2022-08-13 DIAGNOSIS — M1611 Unilateral primary osteoarthritis, right hip: Secondary | ICD-10-CM | POA: Diagnosis not present

## 2022-08-16 ENCOUNTER — Other Ambulatory Visit: Payer: Self-pay

## 2022-08-16 ENCOUNTER — Ambulatory Visit: Payer: Medicare Other | Attending: Orthopedic Surgery | Admitting: Physical Therapy

## 2022-08-16 ENCOUNTER — Encounter: Payer: Self-pay | Admitting: Physical Therapy

## 2022-08-16 DIAGNOSIS — M6281 Muscle weakness (generalized): Secondary | ICD-10-CM | POA: Diagnosis not present

## 2022-08-16 DIAGNOSIS — M25551 Pain in right hip: Secondary | ICD-10-CM | POA: Diagnosis not present

## 2022-08-16 NOTE — Therapy (Signed)
OUTPATIENT PHYSICAL THERAPY LOWER EXTREMITY EVALUATION   Patient Name: Mary Sims MRN: [id number] DOB:March 11, 1956, 67 y.o., female Today's Date: 08/16/2022  END OF SESSION:  PT End of Session - 08/16/22 1045     Visit Number 1    Number of Visits 12    Date for PT Re-Evaluation 11/14/22    Authorization Type FOTO.    PT Start Time 1005    PT Stop Time 1033    PT Time Calculation (min) 28 min    Activity Tolerance Patient tolerated treatment well    Behavior During Therapy WFL for tasks assessed/performed             Past Medical History:  Diagnosis Date   Anxiety    Arthritis    "elbows, hands, knees, thumbs" (09/17/2015)   Chronic bronchitis ([facility])    Complication of anesthesia    slow to wake up after rhinoplasty   Frequent UTI    "1-2 times/year; every year" (09/17/2015)   GERD (gastroesophageal reflux disease)    PONV (postoperative nausea and vomiting)    Walking pneumonia 2004   Past Surgical History:  Procedure Laterality Date   CATARACT EXTRACTION W/ INTRAOCULAR LENS  IMPLANT, BILATERAL Bilateral 2000s   DILATION AND CURETTAGE OF UTERUS  1999   JOINT REPLACEMENT     RHINOPLASTY  2008   TOTAL KNEE ARTHROPLASTY Left 09/17/2015   TOTAL KNEE ARTHROPLASTY Left 09/17/2015   Procedure: TOTAL KNEE ARTHROPLASTY;  Surgeon: Loreta Ave, MD;  Location: [facility] OR;  Service: Orthopedics;  Laterality: Left;   TRIGGER FINGER RELEASE Left ~ 2003   thumb   TUBAL LIGATION  1981   VAGINAL HYSTERECTOMY  1999   Patient Active Problem List   Diagnosis Date Noted   S/P total knee replacement 09/17/2015    REFERRING PROVIDER: Weber Cooks MD  REFERRING DIAG: S/P R Post total hip replacement.  THERAPY DIAG:  Right hip pain - Plan: PT plan of care cert/re-cert  Muscle weakness (generalized) - Plan: PT plan of care cert/re-cert  Rationale for Evaluation and Treatment: Rehabilitation  ONSET DATE: Ongoing, surgery date (08/13/22).  SUBJECTIVE:   SUBJECTIVE  STATEMENT: The patient presents to the clinic s/p right THA performed on 08/13/22.  She is very pleased with her progress and is already walking with a straight cane.  Her pain-level is rated at a 4/10 today.  Ice and medication decrease pain.  She describes the pain as "sore."    PERTINENT HISTORY: Left TKA, left hip pain (plans to have a replacement later this year). PAIN:  Are you having pain? Yes: NPRS scale: 4/10 Pain location: Right hip. Pain description: As above. Aggravating factors: "Hasn't been long enough to judge." Relieving factors: As above.  PRECAUTIONS: Other: THA.  No ultrasound.  WEIGHT BEARING RESTRICTIONS: No  FALLS:  Has patient fallen in last 6 months? No  LIVING ENVIRONMENT: Lives with: lives with their spouse Lives in: House/apartment Has following equipment at home: Single point cane  OCCUPATION: Retired.  PLOF: Independent  PATIENT GOALS: Get out of pain.  OBJECTIVE:   PATIENT SURVEYS:  FOTO .   PALPATION: Aquacel intact.  Expected tenderness.  LOWER EXTREMITY ROM:  Right hip flexion easily to 75 degrees with no pain increase.  LOWER EXTREMITY MMT:  Right hip flex/abd graded grossly ay 4-/5.  GAIT: Gait slow and purposeful with a straight cane on left with a decrease in step and stride length.   ASSESSMENT:  CLINICAL IMPRESSION: The patient presents to [facility] s/p  right THA performed on 08/13/22.  She is doing very well and is very pleased with her surgical outcome thus far.  She is walking safely with a straight cane with a decrease in step and stride length.  She has some expected weakness.  Her Aquacel is intact.  Her FOTO limitation score is a 39.  Patient will benefit from skilled physical therapy intervention to address pain and deficits.  OBJECTIVE IMPAIRMENTS: Abnormal gait, decreased activity tolerance, difficulty walking, decreased strength, and pain.   ACTIVITY LIMITATIONS: carrying, lifting, bending, and locomotion  level  PARTICIPATION LIMITATIONS: meal prep, cleaning, and laundry  REHAB POTENTIAL: Excellent  CLINICAL DECISION MAKING: Stable/uncomplicated  EVALUATION COMPLEXITY: Low   GOALS:  SHORT TERM GOALS: Target date: 08/30/22.  Ind with a HEP. Goal status: INITIAL    LONG TERM GOALS: Target date: 11/14/22.  Perform ADL's with pain not > 2-3/10.  Goal status: INITIAL  2.  Right LE strength to a 5/5 to increase stability for functional tasks.  Goal status: INITIAL  3.  Walk a community distance without assistive device and pain not > 2-3/10. Goal status: INITIAL   PLAN:  PT FREQUENCY: 2x/week  PT DURATION: 6 weeks  PLANNED INTERVENTIONS: Therapeutic exercises, Therapeutic activity, Neuromuscular re-education, Gait training, Patient/Family education, Self Care, Stair training, Cryotherapy, Moist heat, and Manual therapy  PLAN FOR NEXT SESSION: Nustep, GT, strengthening.     Delaina Fetsch, [place], PT 08/16/2022, 11:18 AM

## 2022-08-18 ENCOUNTER — Encounter: Payer: Self-pay | Admitting: Physical Therapy

## 2022-08-18 ENCOUNTER — Ambulatory Visit: Payer: Medicare Other | Admitting: Physical Therapy

## 2022-08-18 DIAGNOSIS — M6281 Muscle weakness (generalized): Secondary | ICD-10-CM

## 2022-08-18 DIAGNOSIS — M25551 Pain in right hip: Secondary | ICD-10-CM

## 2022-08-18 NOTE — Therapy (Signed)
OUTPATIENT PHYSICAL THERAPY LOWER EXTREMITY TREATMENT   Patient Name: Mary Sims MRN: [id number] DOB:[birth year], 67 y.o., female Today's Date: 08/18/2022  END OF SESSION:  PT End of Session - 08/18/22 0954     Visit Number 2    Number of Visits 12    Date for PT Re-Evaluation 11/14/22    Authorization Type UHC MCR    Progress Note Due on Visit 10    PT Start Time [phone]    PT Stop Time 1026    PT Time Calculation (min) 39 min    Activity Tolerance Patient tolerated treatment well    Behavior During Therapy WFL for tasks assessed/performed              Past Medical History:  Diagnosis Date   Anxiety    Arthritis    "elbows, hands, knees, thumbs" (09/17/2015)   Chronic bronchitis ([facility])    Complication of anesthesia    slow to wake up after rhinoplasty   Frequent UTI    "1-2 times/year; every year" (09/17/2015)   GERD (gastroesophageal reflux disease)    PONV (postoperative nausea and vomiting)    Walking pneumonia 2004   Past Surgical History:  Procedure Laterality Date   CATARACT EXTRACTION W/ INTRAOCULAR LENS  IMPLANT, BILATERAL Bilateral 2000s   DILATION AND CURETTAGE OF UTERUS  1999   JOINT REPLACEMENT     RHINOPLASTY  2008   TOTAL KNEE ARTHROPLASTY Left 09/17/2015   TOTAL KNEE ARTHROPLASTY Left 09/17/2015   Procedure: TOTAL KNEE ARTHROPLASTY;  Surgeon: Loreta Ave, MD;  Location: [facility] OR;  Service: Orthopedics;  Laterality: Left;   TRIGGER FINGER RELEASE Left ~ 2003   thumb   TUBAL LIGATION  1981   VAGINAL HYSTERECTOMY  1999   Patient Active Problem List   Diagnosis Date Noted   S/P total knee replacement 09/17/2015    REFERRING PROVIDER: Weber Cooks MD  REFERRING DIAG: S/P R Post total hip replacement.  THERAPY DIAG:  Right hip pain  Muscle weakness (generalized)  Rationale for Evaluation and Treatment: Rehabilitation  ONSET DATE: Ongoing, surgery date (08/13/22).  SUBJECTIVE:   SUBJECTIVE STATEMENT:  Burgess Estelle was a bad day, I  went to pick my leg up to put it on the couch, felt a pop and like my bones were hurting. I can walk relatively well, did a lot of steps doing laundry today and yesterday, I was winded but no hip issues.    PERTINENT HISTORY: Left TKA, left hip pain (plans to have a replacement later this year). PAIN:  Are you having pain? Yes: NPRS scale: 2/10 Pain location: Right hip at incision tight. Pain description: sore Aggravating factors: sleeping position Relieving factors: changing position   PRECAUTIONS: Other: THA.  No ultrasound.  WEIGHT BEARING RESTRICTIONS: No  FALLS:  Has patient fallen in last 6 months? No  LIVING ENVIRONMENT: Lives with: lives with their spouse Lives in: House/apartment Has following equipment at home: Single point cane  OCCUPATION: Retired.  PLOF: Independent  PATIENT GOALS: Get out of pain.    TREATMENT 08/18/22  TherEx  Nustep L4 x6 minutes BLEs only  Supine bridges 2x15 no resistance U clams supine (not crossing neutral/midline due to hip precautions) 2x10 B red TB Mod cues for form/precautions LAQs red TB 2x10 B STS cues for wt midline x10  Standing heel raises x15, then toe raises x15 Hamstring curls 0# x10 R LE R LE marches (less than 90* per hip precautions) x10 standing Gait training in clinic with SPC,  cues for good technique with cane, heel toe pattern, upright posture      OBJECTIVE:   PATIENT SURVEYS:  FOTO .   PALPATION: Aquacel intact.  Expected tenderness.  LOWER EXTREMITY ROM:  Right hip flexion easily to 75 degrees with no pain increase.  LOWER EXTREMITY MMT:  Right hip flex/abd graded grossly ay 4-/5.  GAIT: Gait slow and purposeful with a straight cane on left with a decrease in step and stride length.   ASSESSMENT:  CLINICAL IMPRESSION:  Pam arrives today doing OK, had a bit more pain yesterday but feeling good. Has been staying busy at home without problems. Focused on general functional strengthening  and gait training today, she did reveal some concerns about getting in/out of high bed at home, we can work in this in future sessions. Will continue efforts. RPE 6/10 this session.   OBJECTIVE IMPAIRMENTS: Abnormal gait, decreased activity tolerance, difficulty walking, decreased strength, and pain.   ACTIVITY LIMITATIONS: carrying, lifting, bending, and locomotion level  PARTICIPATION LIMITATIONS: meal prep, cleaning, and laundry  REHAB POTENTIAL: Excellent  CLINICAL DECISION MAKING: Stable/uncomplicated  EVALUATION COMPLEXITY: Low   GOALS:  SHORT TERM GOALS: Target date: 08/30/22.  Ind with a HEP. Goal status: INITIAL    LONG TERM GOALS: Target date: 11/14/22.  Perform ADL's with pain not > 2-3/10.  Goal status: INITIAL  2.  Right LE strength to a 5/5 to increase stability for functional tasks.  Goal status: INITIAL  3.  Walk a community distance without assistive device and pain not > 2-3/10. Goal status: INITIAL   PLAN:  PT FREQUENCY: 2x/week  PT DURATION: 6 weeks  PLANNED INTERVENTIONS: Therapeutic exercises, Therapeutic activity, Neuromuscular re-education, Gait training, Patient/Family education, Self Care, Stair training, Cryotherapy, Moist heat, and Manual therapy  PLAN FOR NEXT SESSION: Nustep, GT, strengthening.  Work on getting into high bed, replicate with high mat table.    Nedra Hai PT DPT PN2

## 2022-08-24 ENCOUNTER — Ambulatory Visit: Payer: Medicare Other | Admitting: *Deleted

## 2022-08-24 DIAGNOSIS — M6281 Muscle weakness (generalized): Secondary | ICD-10-CM | POA: Diagnosis not present

## 2022-08-24 DIAGNOSIS — M25551 Pain in right hip: Secondary | ICD-10-CM

## 2022-08-24 NOTE — Therapy (Signed)
OUTPATIENT PHYSICAL THERAPY LOWER EXTREMITY TREATMENT   Patient Name: Mary Sims MRN: [id number] DOB:1955-09-07, 67 y.o., female Today's Date: 08/24/2022  END OF SESSION:  PT End of Session - 08/24/22 0953     Visit Number 3    Number of Visits 12    Date for PT Re-Evaluation 11/14/22    Authorization Type UHC MCR    Progress Note Due on Visit 10    PT Start Time 0945    PT Stop Time 1032    PT Time Calculation (min) 47 min              Past Medical History:  Diagnosis Date   Anxiety    Arthritis    "elbows, hands, knees, thumbs" (09/17/2015)   Chronic bronchitis ([facility])    Complication of anesthesia    slow to wake up after rhinoplasty   Frequent UTI    "1-2 times/year; every year" (09/17/2015)   GERD (gastroesophageal reflux disease)    PONV (postoperative nausea and vomiting)    Walking pneumonia 2004   Past Surgical History:  Procedure Laterality Date   CATARACT EXTRACTION W/ INTRAOCULAR LENS  IMPLANT, BILATERAL Bilateral 2000s   DILATION AND CURETTAGE OF UTERUS  1999   JOINT REPLACEMENT     RHINOPLASTY  2008   TOTAL KNEE ARTHROPLASTY Left 09/17/2015   TOTAL KNEE ARTHROPLASTY Left 09/17/2015   Procedure: TOTAL KNEE ARTHROPLASTY;  Surgeon: Loreta Ave, MD;  Location: [facility] OR;  Service: Orthopedics;  Laterality: Left;   TRIGGER FINGER RELEASE Left ~ 2003   thumb   TUBAL LIGATION  1981   VAGINAL HYSTERECTOMY  1999   Patient Active Problem List   Diagnosis Date Noted   S/P total knee replacement 09/17/2015    REFERRING PROVIDER: Weber Cooks MD  REFERRING DIAG: S/P R Post total hip replacement.  THERAPY DIAG:  Right hip pain  Muscle weakness (generalized)  Rationale for Evaluation and Treatment: Rehabilitation  ONSET DATE: Ongoing, surgery date (08/13/22).  SUBJECTIVE:   SUBJECTIVE STATEMENT:  Doing okay, but still sore and the swelling is still there RT leg    PERTINENT HISTORY: Left TKA, left hip pain (plans to have a replacement  later this year). PAIN:  Are you having pain? Yes: NPRS scale: 4/10 Pain location: Right hip at incision tight. Pain description: sore Aggravating factors: sleeping position Relieving factors: changing position   PRECAUTIONS: Other: THA.  No ultrasound.  WEIGHT BEARING RESTRICTIONS: No  FALLS:  Has patient fallen in last 6 months? No  LIVING ENVIRONMENT: Lives with: lives with their spouse Lives in: House/apartment Has following equipment at home: Single point cane  OCCUPATION: Retired.  PLOF: Independent  PATIENT GOALS: Get out of pain.    TREATMENT 08/24/22  TherEx  Nustep L4 x15 minutes BLEs seat 7 Supine bridges 2x10 no resistance Hook lying Ball squeeze 2x10 hold 5 secs U clams supine (not crossing neutral/midline due to hip precautions) 2x10 B red TB Mod cues for form/precautions LAQs  Hip ABD. 3x10 RT STS cues for wt midline   2 x10  Standing heel raises 2x15, then toe raises 2x15 Standing Hamstring R LE marches  Rocker boardx 4 mins balance      OBJECTIVE:   PATIENT SURVEYS:  FOTO .   PALPATION: Aquacel intact.  Expected tenderness.  LOWER EXTREMITY ROM:  Right hip flexion easily to 75 degrees with no pain increase.  LOWER EXTREMITY MMT:  Right hip flex/abd graded grossly ay 4-/5.  GAIT: Gait slow and purposeful  with a straight cane on left with a decrease in step and stride length.   ASSESSMENT:  CLINICAL IMPRESSION:  Pt arrived today doing fairly well  with RT hip, but with swelling and quad soreness Rx  focused on general functional strengthening in standing as well as seated exs. Pt challenged for balance o rocker board  and did fairly well.We discussed gait training again for heel/ toe pattern with SPC.  Pt felt good after session.   OBJECTIVE IMPAIRMENTS: Abnormal gait, decreased activity tolerance, difficulty walking, decreased strength, and pain.   ACTIVITY LIMITATIONS: carrying, lifting, bending, and locomotion  level  PARTICIPATION LIMITATIONS: meal prep, cleaning, and laundry  REHAB POTENTIAL: Excellent  CLINICAL DECISION MAKING: Stable/uncomplicated  EVALUATION COMPLEXITY: Low   GOALS:  SHORT TERM GOALS: Target date: 08/30/22.  Ind with a HEP. Goal status: INITIAL    LONG TERM GOALS: Target date: 11/14/22.  Perform ADL's with pain not > 2-3/10.  Goal status: INITIAL  2.  Right LE strength to a 5/5 to increase stability for functional tasks.  Goal status: INITIAL  3.  Walk a community distance without assistive device and pain not > 2-3/10. Goal status: INITIAL   PLAN:  PT FREQUENCY: 2x/week  PT DURATION: 6 weeks  PLANNED INTERVENTIONS: Therapeutic exercises, Therapeutic activity, Neuromuscular re-education, Gait training, Patient/Family education, Self Care, Stair training, Cryotherapy, Moist heat, and Manual therapy  PLAN FOR NEXT SESSION: Nustep, GT, strengthening.  Work on getting into high bed, replicate with high mat table.    Nedra Hai PT DPT PN2

## 2022-08-26 ENCOUNTER — Ambulatory Visit: Payer: Medicare Other | Admitting: Physical Therapy

## 2022-08-26 DIAGNOSIS — M25551 Pain in right hip: Secondary | ICD-10-CM | POA: Diagnosis not present

## 2022-08-26 DIAGNOSIS — M6281 Muscle weakness (generalized): Secondary | ICD-10-CM

## 2022-08-26 NOTE — Therapy (Addendum)
OUTPATIENT PHYSICAL THERAPY LOWER EXTREMITY TREATMENT   Patient Name: Mary Sims MRN: [id number] DOB:August 29, 1955, 67 y.o., female Today's Date: 08/26/2022  END OF SESSION:  PT End of Session - 08/26/22 1004     Visit Number 4    Number of Visits 12    Date for PT Re-Evaluation 11/14/22    Authorization Type [employer] MCR    PT Start Time 0945    PT Stop Time 1030    PT Time Calculation (min) 45 min    Activity Tolerance Patient tolerated treatment well    Behavior During Therapy WFL for tasks assessed/performed              Past Medical History:  Diagnosis Date   Anxiety    Arthritis    "elbows, hands, knees, thumbs" (09/17/2015)   Chronic bronchitis (HCC)    Complication of anesthesia    slow to wake up after rhinoplasty   Frequent UTI    "1-2 times/year; every year" (09/17/2015)   GERD (gastroesophageal reflux disease)    PONV (postoperative nausea and vomiting)    Walking pneumonia 2004   Past Surgical History:  Procedure Laterality Date   CATARACT EXTRACTION W/ INTRAOCULAR LENS  IMPLANT, BILATERAL Bilateral 2000s   DILATION AND CURETTAGE OF UTERUS  1999   JOINT REPLACEMENT     RHINOPLASTY  2008   TOTAL KNEE ARTHROPLASTY Left 09/17/2015   TOTAL KNEE ARTHROPLASTY Left 09/17/2015   Procedure: TOTAL KNEE ARTHROPLASTY;  Surgeon: Loreta Ave, MD;  Location: [facility] OR;  Service: Orthopedics;  Laterality: Left;   TRIGGER FINGER RELEASE Left ~ 2003   thumb   TUBAL LIGATION  1981   VAGINAL HYSTERECTOMY  1999   Patient Active Problem List   Diagnosis Date Noted   S/P total knee replacement 09/17/2015    REFERRING PROVIDER: Weber Cooks MD  REFERRING DIAG: S/P R Post total hip replacement.  THERAPY DIAG:  Right hip pain  Muscle weakness (generalized)  Rationale for Evaluation and Treatment: Rehabilitation  ONSET DATE: Ongoing, surgery date (08/13/22).  SUBJECTIVE:   SUBJECTIVE STATEMENT:  More pain at night but doing good.  Get bandage off.  Not using  my cane today.  PERTINENT HISTORY: Left TKA, left hip pain (plans to have a replacement later this year). PAIN:  Are you having pain? Yes: NPRS scale: 2/10 Pain location: Right hip at incision tight. Pain description: sore Aggravating factors: sleeping position Relieving factors: changing position   PRECAUTIONS: Other: THA.  No ultrasound.  WEIGHT BEARING RESTRICTIONS: No  FALLS:  Has patient fallen in last 6 months? No  LIVING ENVIRONMENT: Lives with: lives with their spouse Lives in: House/apartment Has following equipment at home: Single point cane  OCCUPATION: Retired.  PLOF: Independent  PATIENT GOALS: Get out of pain.    TREATMENT 08/26/22:                                     EXERCISE LOG  Exercise Repetitions and Resistance Comments  Nustep L3 15 minutes LE's only.   Rockerboard In parallel bars x 5 minutes   Airex balance pad Weight shifts in parallel bars x 5 minutes   LAQ's 5# x 4 minutes   SAQ's 5# x 4 minutes      OBJECTIVE:   PATIENT SURVEYS:  FOTO .   PALPATION: Aquacel intact.  Expected tenderness.  LOWER EXTREMITY ROM:  Right hip flexion easily to 75  degrees with no pain increase.  LOWER EXTREMITY MMT:  Right hip flex/abd graded grossly ay 4-/5.  GAIT: Gait slow and purposeful with a straight cane on left with a decrease in step and stride length.   ASSESSMENT:  CLINICAL IMPRESSION: Patient I making excellent progress.  She is without cane in clinic and her gait is normalizing nicely.  She is highly motivated and is very pleased with her progress.  Performed non-reciprocating stairs (4) up and down with one railing without difficulty and excellent technique.    OBJECTIVE IMPAIRMENTS: Abnormal gait, decreased activity tolerance, difficulty walking, decreased strength, and pain.   ACTIVITY LIMITATIONS: carrying, lifting, bending, and locomotion level  PARTICIPATION LIMITATIONS: meal prep, cleaning, and laundry  REHAB POTENTIAL:  Excellent  CLINICAL DECISION MAKING: Stable/uncomplicated  EVALUATION COMPLEXITY: Low   GOALS:  SHORT TERM GOALS: Target date: 08/30/22.  Ind with a HEP. Goal status: INITIAL    LONG TERM GOALS: Target date: 11/14/22.  Perform ADL's with pain not > 2-3/10.  Goal status: INITIAL  2.  Right LE strength to a 5/5 to increase stability for functional tasks.  Goal status: INITIAL  3.  Walk a community distance without assistive device and pain not > 2-3/10. Goal status: INITIAL   PLAN:  PT FREQUENCY: 2x/week  PT DURATION: 6 weeks  PLANNED INTERVENTIONS: Therapeutic exercises, Therapeutic activity, Neuromuscular re-education, Gait training, Patient/Family education, Self Care, Stair training, Cryotherapy, Moist heat, and Manual therapy  PLAN FOR NEXT SESSION: Nustep, GT, strengthening.  Work on getting into high bed, replicate with high mat table.   [place] Dula Havlik MPT

## 2022-08-31 ENCOUNTER — Ambulatory Visit: Payer: Medicare Other | Attending: Orthopedic Surgery | Admitting: Physical Therapy

## 2022-08-31 ENCOUNTER — Encounter: Payer: Self-pay | Admitting: Physical Therapy

## 2022-08-31 DIAGNOSIS — M25551 Pain in right hip: Secondary | ICD-10-CM | POA: Insufficient documentation

## 2022-08-31 DIAGNOSIS — M1611 Unilateral primary osteoarthritis, right hip: Secondary | ICD-10-CM | POA: Diagnosis not present

## 2022-08-31 DIAGNOSIS — M6281 Muscle weakness (generalized): Secondary | ICD-10-CM | POA: Insufficient documentation

## 2022-08-31 NOTE — Therapy (Signed)
OUTPATIENT PHYSICAL THERAPY LOWER EXTREMITY TREATMENT   Patient Name: Mary Sims MRN: [id number] DOB:11/14/1955, 67 y.o., female Today's Date: 08/31/2022  END OF SESSION:  PT End of Session - 08/31/22 1434     Visit Number 5    Number of Visits 12    Date for PT Re-Evaluation 11/14/22    Authorization Type U[employer] MCR    Progress Note Due on Visit 10    PT Start Time 1431    PT Stop Time 1504    PT Time Calculation (min) 33 min    Activity Tolerance Patient tolerated treatment well    Behavior During Therapy WFL for tasks assessed/performed            Past Medical History:  Diagnosis Date   Anxiety    Arthritis    "elbows, hands, knees, thumbs" (09/17/2015)   Chronic bronchitis (HCC)    Complication of anesthesia    slow to wake up after rhinoplasty   Frequent UTI    "1-2 times/year; every year" (09/17/2015)   GERD (gastroesophageal reflux disease)    PONV (postoperative nausea and vomiting)    Walking pneumonia 2004   Past Surgical History:  Procedure Laterality Date   CATARACT EXTRACTION W/ INTRAOCULAR LENS  IMPLANT, BILATERAL Bilateral 2000s   DILATION AND CURETTAGE OF UTERUS  1999   JOINT REPLACEMENT     RHINOPLASTY  2008   TOTAL KNEE ARTHROPLASTY Left 09/17/2015   TOTAL KNEE ARTHROPLASTY Left 09/17/2015   Procedure: TOTAL KNEE ARTHROPLASTY;  Surgeon: Loreta Ave, MD;  Location: [facility] OR;  Service: Orthopedics;  Laterality: Left;   TRIGGER FINGER RELEASE Left ~ 2003   thumb   TUBAL LIGATION  1981   VAGINAL HYSTERECTOMY  1999   Patient Active Problem List   Diagnosis Date Noted   S/P total knee replacement 09/17/2015   REFERRING PROVIDER: Weber Cooks MD  REFERRING DIAG: S/P R Post total hip replacement.  THERAPY DIAG:  Right hip pain  Muscle weakness (generalized)  Rationale for Evaluation and Treatment: Rehabilitation  ONSET DATE: Ongoing, surgery date (08/13/22).  SUBJECTIVE:   SUBJECTIVE STATEMENT: Requests an easy treatment as she just  got back from her Orthopedist appointment and had to walk a long ways and he removed her bandage.  PERTINENT HISTORY: Left TKA, left hip pain (plans to have a replacement later this year).  PAIN:  Are you having pain? Yes: NPRS scale: 2/10 Pain location: Right hip at incision tight. Pain description: sore Aggravating factors: sleeping position Relieving factors: changing position   PRECAUTIONS: Other: THA.  No ultrasound.  PATIENT GOALS: Get out of pain.  TREATMENT 08/31/22:  EXERCISE LOG  Exercise Repetitions and Resistance Comments  Nustep  L3, seat 10, 15 min LE's only.   Rockerboard X3 min for stretch   Airex balance pad NBOS x3 min   LAQ's 5# x 4 minutes   Hip flexion, abd, ext Standing // bars 2x10 reps   Clam Green theraband x15 reps Limited by soreness and discomfort  Ball squeeze X3 min    OBJECTIVE:   PATIENT SURVEYS:  FOTO .  PALPATION: Postsurgical dressing removed 08/31/22 and tender  LOWER EXTREMITY ROM: Right hip flexion easily to 75 degrees with no pain increase.  LOWER EXTREMITY MMT: Right hip flex/abd graded grossly ay 4-/5.  GAIT:Mod antalgic gait without AD due to fatigue and tenderness  ASSESSMENT:  CLINICAL IMPRESSION: Patient more fatigued and ambulating with more antalgic gait due to fatigue and soreness with walking long distance and  MD visit. Patient able to tolerate mild standing exercises with VC for gentle ROM. Patient most challenged by seated clam but was fatigued and sore by end of PT session.  OBJECTIVE IMPAIRMENTS: Abnormal gait, decreased activity tolerance, difficulty walking, decreased strength, and pain.   ACTIVITY LIMITATIONS: carrying, lifting, bending, and locomotion level  PARTICIPATION LIMITATIONS: meal prep, cleaning, and laundry  REHAB POTENTIAL: Excellent  CLINICAL DECISION MAKING: Stable/uncomplicated  EVALUATION COMPLEXITY: Low  GOALS:  SHORT TERM GOALS: Target date: 08/30/22.  Ind with a HEP. Goal status:  INITIAL  LONG TERM GOALS: Target date: 11/14/22.  Perform ADL's with pain not > 2-3/10.  Goal status: INITIAL  2.  Right LE strength to a 5/5 to increase stability for functional tasks.  Goal status: INITIAL  3.  Walk a community distance without assistive device and pain not > 2-3/10. Goal status: INITIAL  PLAN:  PT FREQUENCY: 2x/week  PT DURATION: 6 weeks  PLANNED INTERVENTIONS: Therapeutic exercises, Therapeutic activity, Neuromuscular re-education, Gait training, Patient/Family education, Self Care, Stair training, Cryotherapy, Moist heat, and Manual therapy  PLAN FOR NEXT SESSION: Nustep, GT, strengthening.  Work on getting into high bed, replicate with high mat table.   Marvell Fuller, PTA 08/31/22 3:10 PM

## 2022-09-02 ENCOUNTER — Ambulatory Visit: Payer: Medicare Other | Admitting: *Deleted

## 2022-09-02 DIAGNOSIS — M6281 Muscle weakness (generalized): Secondary | ICD-10-CM

## 2022-09-02 DIAGNOSIS — M25551 Pain in right hip: Secondary | ICD-10-CM | POA: Diagnosis not present

## 2022-09-02 NOTE — Therapy (Signed)
OUTPATIENT PHYSICAL THERAPY LOWER EXTREMITY TREATMENT   Patient Name: Mary Sims MRN: [id number] DOB:Sep 30, 1955, 67 y.o., female Today's Date: 09/02/2022  END OF SESSION:  PT End of Session - 09/02/22 0935     Visit Number 6    Number of Visits 12    Date for PT Re-Evaluation 11/14/22    Authorization Type UHC MCR    Progress Note Due on Visit 10    PT Start Time 0930    PT Stop Time 1020    PT Time Calculation (min) 50 min            Past Medical History:  Diagnosis Date   Anxiety    Arthritis    "elbows, hands, knees, thumbs" (09/17/2015)   Chronic bronchitis (HCC)    Complication of anesthesia    slow to wake up after rhinoplasty   Frequent UTI    "1-2 times/year; every year" (09/17/2015)   GERD (gastroesophageal reflux disease)    PONV (postoperative nausea and vomiting)    Walking pneumonia 2004   Past Surgical History:  Procedure Laterality Date   CATARACT EXTRACTION W/ INTRAOCULAR LENS  IMPLANT, BILATERAL Bilateral 2000s   DILATION AND CURETTAGE OF UTERUS  1999   JOINT REPLACEMENT     RHINOPLASTY  2008   TOTAL KNEE ARTHROPLASTY Left 09/17/2015   TOTAL KNEE ARTHROPLASTY Left 09/17/2015   Procedure: TOTAL KNEE ARTHROPLASTY;  Surgeon: Loreta Ave, MD;  Location: [facility] OR;  Service: Orthopedics;  Laterality: Left;   TRIGGER FINGER RELEASE Left ~ 2003   thumb   TUBAL LIGATION  1981   VAGINAL HYSTERECTOMY  1999   Patient Active Problem List   Diagnosis Date Noted   S/P total knee replacement 09/17/2015   REFERRING PROVIDER: Weber Cooks MD  REFERRING DIAG: S/P R Post total hip replacement.  THERAPY DIAG:  Muscle weakness (generalized)  Rationale for Evaluation and Treatment: Rehabilitation  ONSET DATE: Ongoing, surgery date (08/13/22).  SUBJECTIVE:   SUBJECTIVE STATEMENT: Requests an easy treatment as she just got back from her Orthopedist appointment and had to walk a long ways and he removed her bandage.  PERTINENT HISTORY: Left TKA, left  hip pain (plans to have a replacement later this year).  PAIN:  Are you having pain? Yes: NPRS scale: 2/10 Pain location: Right hip at incision tight. Pain description: sore Aggravating factors: sleeping position Relieving factors: changing position   PRECAUTIONS: Other: THA.  No ultrasound.  PATIENT GOALS: Get out of pain.  TREATMENT                                                                            09/02/22:  EXERCISE LOG  Exercise Repetitions and Resistance Comments  Nustep  L3, seat 10, 17 min LE's only.   Rockerboard X5 min for stretch and balance   Airex balance pad    LAQ's 5#  3 x10 pause at top   Hip flexion, abd, Standing // bars 3x10 reps   Clam Green theraband x15 reps Limited by soreness and discomfort  Ball squeeze with LAQ    Balance beam Side step x 3 mins   Sit to stand X10 with focus on Glutes    OBJECTIVE:  PATIENT SURVEYS:  FOTO .  PALPATION: Postsurgical dressing removed 08/31/22 and tender  LOWER EXTREMITY ROM: Right hip flexion easily to 75 degrees with no pain increase.  LOWER EXTREMITY MMT: Right hip flex/abd graded grossly ay 4-/5.  GAIT:Mod antalgic gait without AD due to fatigue and tenderness  ASSESSMENT:  CLINICAL IMPRESSION: Patient arrived today doing fairly well with RT hip, but c/o soreness and weakness RT thigh. Rx focused on RT LE strengthening as well as balance. Pt did better with balance Exs today and sit to stand with focus on glutes.      OBJECTIVE IMPAIRMENTS: Abnormal gait, decreased activity tolerance, difficulty walking, decreased strength, and pain.   ACTIVITY LIMITATIONS: carrying, lifting, bending, and locomotion level  PARTICIPATION LIMITATIONS: meal prep, cleaning, and laundry  REHAB POTENTIAL: Excellent  CLINICAL DECISION MAKING: Stable/uncomplicated  EVALUATION COMPLEXITY: Low  GOALS:  SHORT TERM GOALS: Target date: 08/30/22.  Ind with a HEP. Goal status: INITIAL  LONG TERM GOALS: Target  date: 11/14/22.  Perform ADL's with pain not > 2-3/10.  Goal status: INITIAL  2.  Right LE strength to a 5/5 to increase stability for functional tasks.  Goal status: INITIAL  3.  Walk a community distance without assistive device and pain not > 2-3/10. Goal status: INITIAL  PLAN:  PT FREQUENCY: 2x/week  PT DURATION: 6 weeks  PLANNED INTERVENTIONS: Therapeutic exercises, Therapeutic activity, Neuromuscular re-education, Gait training, Patient/Family education, Self Care, Stair training, Cryotherapy, Moist heat, and Manual therapy  PLAN FOR NEXT SESSION: Nustep, GT, strengthening.  Work on getting into high bed, replicate with high mat table.   Marvell Fuller, PTA 09/02/22 1:22 PM

## 2022-09-07 ENCOUNTER — Ambulatory Visit: Payer: Medicare Other | Admitting: Physical Therapy

## 2022-09-09 ENCOUNTER — Encounter: Payer: Medicare Other | Admitting: Physical Therapy

## 2022-09-16 ENCOUNTER — Ambulatory Visit: Payer: Medicare Other | Admitting: Physical Therapy

## 2022-09-16 ENCOUNTER — Encounter: Payer: Self-pay | Admitting: Physical Therapy

## 2022-09-16 DIAGNOSIS — M25551 Pain in right hip: Secondary | ICD-10-CM | POA: Diagnosis not present

## 2022-09-16 DIAGNOSIS — M6281 Muscle weakness (generalized): Secondary | ICD-10-CM | POA: Diagnosis not present

## 2022-09-16 NOTE — Therapy (Signed)
OUTPATIENT PHYSICAL THERAPY LOWER EXTREMITY TREATMENT   Patient Name: Mary Sims MRN: [id number] DOB:08-Sep-1955, 67 y.o., female Today's Date: 09/16/2022  END OF SESSION:  PT End of Session - 09/16/22 1021     Visit Number 7    Number of Visits 12    Date for PT Re-Evaluation 11/14/22    Authorization Type UHC MCR    Progress Note Due on Visit 10    PT Start Time 1018    PT Stop Time 1105    PT Time Calculation (min) 47 min    Activity Tolerance Patient tolerated treatment well    Behavior During Therapy WFL for tasks assessed/performed            Past Medical History:  Diagnosis Date   Anxiety    Arthritis    "elbows, hands, knees, thumbs" (09/17/2015)   Chronic bronchitis (HCC)    Complication of anesthesia    slow to wake up after rhinoplasty   Frequent UTI    "1-2 times/year; every year" (09/17/2015)   GERD (gastroesophageal reflux disease)    PONV (postoperative nausea and vomiting)    Walking pneumonia 2004   Past Surgical History:  Procedure Laterality Date   CATARACT EXTRACTION W/ INTRAOCULAR LENS  IMPLANT, BILATERAL Bilateral 2000s   DILATION AND CURETTAGE OF UTERUS  1999   JOINT REPLACEMENT     RHINOPLASTY  2008   TOTAL KNEE ARTHROPLASTY Left 09/17/2015   TOTAL KNEE ARTHROPLASTY Left 09/17/2015   Procedure: TOTAL KNEE ARTHROPLASTY;  Surgeon: Loreta Ave, MD;  Location: [facility] OR;  Service: Orthopedics;  Laterality: Left;   TRIGGER FINGER RELEASE Left ~ 2003   thumb   TUBAL LIGATION  1981   VAGINAL HYSTERECTOMY  1999   Patient Active Problem List   Diagnosis Date Noted   S/P total knee replacement 09/17/2015   REFERRING PROVIDER: Weber Cooks MD  REFERRING DIAG: S/P R Post total hip replacement.  THERAPY DIAG:  Muscle weakness (generalized)  Right hip pain  Rationale for Evaluation and Treatment: Rehabilitation  ONSET DATE: Ongoing, surgery date (08/13/22).  SUBJECTIVE:   SUBJECTIVE STATEMENT: Able to drive the car without pain.  Has more stiffness with transfers and sitting in the car but not pain. Cannot stand or walk through loose sand at the beach yet.  PERTINENT HISTORY: Left TKA, left hip pain (plans to have a replacement later this year).  PAIN:  Are you having pain? Yes: NPRS scale: 2/10 Pain location: Right hip at incision tight. Pain description: sore Aggravating factors: sleeping position Relieving factors: changing position   PRECAUTIONS: Other: THA.  No ultrasound.  PATIENT GOALS: Get out of pain.  TREATMENT  09/26/22: EXERCISE LOG  Exercise Repetitions and Resistance Comments  Nustep  L3, seat 7, 15 min LE's only.   Airex balance pad NBOS, semitandem x2 min each   LAQ's 4#  3 x10 pause at top   Hip flexion, abd, HS curl Standing // bars 3x10 reps   Clam Red theraband x15 reps   Forward step up 6" step x20 reps   Lateral step up 6" step x20 reps   Sit to stand X10 with focus on Glutes    OBJECTIVE:   PATIENT SURVEYS:  FOTO .  PALPATION: No tenderness indicated  LOWER EXTREMITY ROM: Right hip flexion easily to 75 degrees with no pain increase.  LOWER EXTREMITY MMT: Right hip flex/abd graded grossly ay 4-/5.  GAIT:Mod antalgic gait without AD due to fatigue and tenderness  ASSESSMENT:  CLINICAL  IMPRESSION: Patient progressed in clinic to more standing and strengthening exercises. No issues with step ups activities and patient stays active with stairs within her home. Patient progressed to airex training due to difficulty with loose sand at the beach. Patient still not comfortable with toting her dog down stairs at this time but able to complete stairs well. Patient reports difficulty with clams.  OBJECTIVE IMPAIRMENTS: Abnormal gait, decreased activity tolerance, difficulty walking, decreased strength, and pain.   ACTIVITY LIMITATIONS: carrying, lifting, bending, and locomotion level  PARTICIPATION LIMITATIONS: meal prep, cleaning, and laundry  REHAB POTENTIAL:  Excellent  CLINICAL DECISION MAKING: Stable/uncomplicated  EVALUATION COMPLEXITY: Low  GOALS:  SHORT TERM GOALS: Target date: 08/30/22.  Ind with a HEP. Goal status: INITIAL  LONG TERM GOALS: Target date: 11/14/22.  Perform ADL's with pain not > 2-3/10.  Goal status: INITIAL  2.  Right LE strength to a 5/5 to increase stability for functional tasks.  Goal status: INITIAL  3.  Walk a community distance without assistive device and pain not > 2-3/10. Goal status: INITIAL  PLAN:  PT FREQUENCY: 2x/week  PT DURATION: 6 weeks  PLANNED INTERVENTIONS: Therapeutic exercises, Therapeutic activity, Neuromuscular re-education, Gait training, Patient/Family education, Self Care, Stair training, Cryotherapy, Moist heat, and Manual therapy  PLAN FOR NEXT SESSION: Nustep, GT, strengthening.  Work on getting into high bed, replicate with high mat table.   Marvell Fuller, PTA 09/16/22 12:01 PM

## 2022-09-17 ENCOUNTER — Ambulatory Visit: Payer: Medicare Other

## 2022-09-17 DIAGNOSIS — M25551 Pain in right hip: Secondary | ICD-10-CM | POA: Diagnosis not present

## 2022-09-17 DIAGNOSIS — M6281 Muscle weakness (generalized): Secondary | ICD-10-CM

## 2022-09-17 DIAGNOSIS — Z0001 Encounter for general adult medical examination with abnormal findings: Secondary | ICD-10-CM | POA: Diagnosis not present

## 2022-09-17 NOTE — Therapy (Signed)
OUTPATIENT PHYSICAL THERAPY LOWER EXTREMITY TREATMENT   Patient Name: Mary Sims MRN: [id number] DOB:[birth year], 67 y.o., female Today's Date: 09/17/2022  END OF SESSION:  PT End of Session - 09/17/22 1021     Visit Number 8    Number of Visits 12    Date for PT Re-Evaluation 11/14/22    Authorization Type UHC MCR    Progress Note Due on Visit 10    PT Start Time 1015    PT Stop Time 1100    PT Time Calculation (min) 45 min    Activity Tolerance Patient tolerated treatment well    Behavior During Therapy WFL for tasks assessed/performed            Past Medical History:  Diagnosis Date   Anxiety    Arthritis    "elbows, hands, knees, thumbs" (09/17/2015)   Chronic bronchitis (HCC)    Complication of anesthesia    slow to wake up after rhinoplasty   Frequent UTI    "1-2 times/year; every year" (09/17/2015)   GERD (gastroesophageal reflux disease)    PONV (postoperative nausea and vomiting)    Walking pneumonia 2004   Past Surgical History:  Procedure Laterality Date   CATARACT EXTRACTION W/ INTRAOCULAR LENS  IMPLANT, BILATERAL Bilateral 2000s   DILATION AND CURETTAGE OF UTERUS  1999   JOINT REPLACEMENT     RHINOPLASTY  2008   TOTAL KNEE ARTHROPLASTY Left 09/17/2015   TOTAL KNEE ARTHROPLASTY Left 09/17/2015   Procedure: TOTAL KNEE ARTHROPLASTY;  Surgeon: Loreta Ave, MD;  Location: [facility] OR;  Service: Orthopedics;  Laterality: Left;   TRIGGER FINGER RELEASE Left ~ 2003   thumb   TUBAL LIGATION  1981   VAGINAL HYSTERECTOMY  1999   Patient Active Problem List   Diagnosis Date Noted   S/P total knee replacement 09/17/2015   REFERRING PROVIDER: Weber Cooks MD  REFERRING DIAG: S/P R Post total hip replacement.  THERAPY DIAG:  Muscle weakness (generalized)  Right hip pain  Rationale for Evaluation and Treatment: Rehabilitation  ONSET DATE: Ongoing, surgery date (08/13/22).  SUBJECTIVE:   SUBJECTIVE STATEMENT: Pt reports being active yesterday with  minimal pain.  Did not have to take any pain meds the past three days.   PERTINENT HISTORY: Left TKA, left hip pain (plans to have a replacement later this year).  PAIN:  Are you having pain? No  PRECAUTIONS: Other: THA.  No ultrasound.  PATIENT GOALS: Get out of pain.  TREATMENT  09/17/22: EXERCISE LOG  Exercise Repetitions and Resistance Comments  Nustep  Lvl 4, seat 7, 15 min LE's only.   Airex balance pad    LAQ's 4#  4x10 pause at top   Hip flexion, abd, HS curl 2# x20 reps each   Clam Red theraband x20 reps   Seated Marches 2# x 20 reps with hold   Ball Squeezes 2 mins   Forward step up 6" step x25 reps   Lateral step up 6" step x25 reps   Sit to stand X15 with focus on Glutes    OBJECTIVE:   PATIENT SURVEYS:  FOTO .  PALPATION: No tenderness indicated  LOWER EXTREMITY ROM: Right hip flexion easily to 75 degrees with no pain increase.  LOWER EXTREMITY MMT: Right hip flex/abd graded grossly ay 4-/5.  GAIT:Mod antalgic gait without AD due to fatigue and tenderness  ASSESSMENT:  CLINICAL IMPRESSION: Pt arrives for today's treatment session session denying any pain.  Pt able to increase FOTO score to 65  today.  Pt able to tolerate increased reps with all exercises today with minimal fatigue.  Pt reports using her RLE more as previously instructed.  Pt reports increased stiffness in the morning and when first standing up.  Pt denied any pain at completion of today's treatment session.   OBJECTIVE IMPAIRMENTS: Abnormal gait, decreased activity tolerance, difficulty walking, decreased strength, and pain.   ACTIVITY LIMITATIONS: carrying, lifting, bending, and locomotion level  PARTICIPATION LIMITATIONS: meal prep, cleaning, and laundry  REHAB POTENTIAL: Excellent  CLINICAL DECISION MAKING: Stable/uncomplicated  EVALUATION COMPLEXITY: Low  GOALS:  SHORT TERM GOALS: Target date: 08/30/22.  Ind with a HEP. Goal status: MET  LONG TERM GOALS: Target date:  11/14/22.  Perform ADL's with pain not > 2-3/10.  Goal status: MET  2.  Right LE strength to a 5/5 to increase stability for functional tasks.  Goal status: IN PROGRESS  3.  Walk a community distance without assistive device and pain not > 2-3/10.  6/21: 4-5/10 with community distances Goal status: IN PROGRESS   PLAN:  PT FREQUENCY: 2x/week  PT DURATION: 6 weeks  PLANNED INTERVENTIONS: Therapeutic exercises, Therapeutic activity, Neuromuscular re-education, Gait training, Patient/Family education, Self Care, Stair training, Cryotherapy, Moist heat, and Manual therapy  PLAN FOR NEXT SESSION: Nustep, GT, strengthening.  Work on getting into high bed, replicate with high mat table.   Marvell Fuller, PTA 09/17/22 11:12 AM

## 2022-09-21 ENCOUNTER — Ambulatory Visit: Payer: Medicare Other | Admitting: *Deleted

## 2022-09-21 DIAGNOSIS — M25551 Pain in right hip: Secondary | ICD-10-CM

## 2022-09-21 DIAGNOSIS — M6281 Muscle weakness (generalized): Secondary | ICD-10-CM

## 2022-09-21 NOTE — Therapy (Signed)
OUTPATIENT PHYSICAL THERAPY LOWER EXTREMITY TREATMENT   Patient Name: Mary Sims MRN: [id number] DOB:04/05/1955, 67 y.o., female Today's Date: 09/21/2022  END OF SESSION:  PT End of Session - 09/21/22 0859     Visit Number 9    Number of Visits 12    Date for PT Re-Evaluation 11/14/22    Authorization Type [employer] MCR    PT Start Time 0845    PT Stop Time 0934    PT Time Calculation (min) 49 min            Past Medical History:  Diagnosis Date   Anxiety    Arthritis    "elbows, hands, knees, thumbs" (09/17/2015)   Chronic bronchitis (HCC)    Complication of anesthesia    slow to wake up after rhinoplasty   Frequent UTI    "1-2 times/year; every year" (09/17/2015)   GERD (gastroesophageal reflux disease)    PONV (postoperative nausea and vomiting)    Walking pneumonia 2004   Past Surgical History:  Procedure Laterality Date   CATARACT EXTRACTION W/ INTRAOCULAR LENS  IMPLANT, BILATERAL Bilateral 2000s   DILATION AND CURETTAGE OF UTERUS  1999   JOINT REPLACEMENT     RHINOPLASTY  2008   TOTAL KNEE ARTHROPLASTY Left 09/17/2015   TOTAL KNEE ARTHROPLASTY Left 09/17/2015   Procedure: TOTAL KNEE ARTHROPLASTY;  Surgeon: Loreta Ave, MD;  Location: [facility] OR;  Service: Orthopedics;  Laterality: Left;   TRIGGER FINGER RELEASE Left ~ 2003   thumb   TUBAL LIGATION  1981   VAGINAL HYSTERECTOMY  1999   Patient Active Problem List   Diagnosis Date Noted   S/P total knee replacement 09/17/2015   REFERRING PROVIDER: Weber Cooks MD  REFERRING DIAG: S/P R Post total hip replacement.  THERAPY DIAG:  Muscle weakness (generalized)  Right hip pain  Rationale for Evaluation and Treatment: Rehabilitation  ONSET DATE: Ongoing, surgery date (08/13/22).  SUBJECTIVE:   SUBJECTIVE STATEMENT: Pt reports being active yesterday with  PERTINENT HISTORY: Left TKA, left hip pain (plans to have a replacement later this year).  PAIN:  Are you having pain? No  PRECAUTIONS:  Other: THA.  No ultrasound.  PATIENT GOALS: Get out of pain.  TREATMENT  09/21/22: EXERCISE LOG      6 weeks post op 09-24-22  RT hip  Exercise Repetitions and Resistance Comments  Nustep  Lvl 4, seat 7, 15 min LE's only.   BOSU inverted X 5 mins balance   Balance beam X 3 mins side stepping   LAQ's 4#  3x10 pause at top   Hip flexion, abd, HS curl 2# x20 reps each   Clam Red theraband x20 reps   Seated [employer] Squeezes 2 mins   Forward step up 6" step x20 reps   Lateral step up 6" step x20 reps   Sit to stand X15 with focus on Glutes    OBJECTIVE:   PATIENT SURVEYS:  FOTO .  PALPATION: No tenderness indicated  LOWER EXTREMITY ROM: Right hip flexion easily to 75 degrees with no pain increase.  LOWER EXTREMITY MMT: Right hip flex/abd graded grossly ay 4-/5.  GAIT:Mod antalgic gait without AD due to fatigue and tenderness  ASSESSMENT:  CLINICAL IMPRESSION: Pt arrived doing fairly well today and reports that she can sleep on RT side some now and steps are getting easier. Rx focused on RT LE strengthening in sitting as well as standing. Pt showed much improvement with balance exs today with less  UE assist. 3 visits left and then DC to HEP    OBJECTIVE IMPAIRMENTS: Abnormal gait, decreased activity tolerance, difficulty walking, decreased strength, and pain.   ACTIVITY LIMITATIONS: carrying, lifting, bending, and locomotion level  PARTICIPATION LIMITATIONS: meal prep, cleaning, and laundry  REHAB POTENTIAL: Excellent  CLINICAL DECISION MAKING: Stable/uncomplicated  EVALUATION COMPLEXITY: Low  GOALS:  SHORT TERM GOALS: Target date: 08/30/22.  Ind with a HEP. Goal status: MET  LONG TERM GOALS: Target date: 11/14/22.  Perform ADL's with pain not > 2-3/10.  Goal status: MET  2.  Right LE strength to a 5/5 to increase stability for functional tasks.  Goal status: IN PROGRESS  3.  Walk a community distance without assistive device and pain not >  2-3/10.  6/21: 4-5/10 with community distances Goal status: IN PROGRESS   PLAN:  PT FREQUENCY: 2x/week  PT DURATION: 6 weeks  PLANNED INTERVENTIONS: Therapeutic exercises, Therapeutic activity, Neuromuscular re-education, Gait training, Patient/Family education, Self Care, Stair training, Cryotherapy, Moist heat, and Manual therapy  PLAN FOR NEXT SESSION: Nustep, GT, strengthening.  Work on getting into high bed, replicate with high mat table.   Marvell Fuller, PTA 09/21/22 9:38 AM

## 2022-09-23 ENCOUNTER — Encounter: Payer: Medicare Other | Admitting: Physical Therapy

## 2022-09-28 ENCOUNTER — Ambulatory Visit: Payer: Medicare Other | Admitting: Physical Therapy

## 2022-09-28 DIAGNOSIS — M1611 Unilateral primary osteoarthritis, right hip: Secondary | ICD-10-CM | POA: Diagnosis not present

## 2022-09-29 DIAGNOSIS — K219 Gastro-esophageal reflux disease without esophagitis: Secondary | ICD-10-CM | POA: Diagnosis not present

## 2022-09-29 DIAGNOSIS — E7849 Other hyperlipidemia: Secondary | ICD-10-CM | POA: Diagnosis not present

## 2022-09-29 DIAGNOSIS — I10 Essential (primary) hypertension: Secondary | ICD-10-CM | POA: Diagnosis not present

## 2022-10-04 DIAGNOSIS — E7849 Other hyperlipidemia: Secondary | ICD-10-CM | POA: Diagnosis not present

## 2022-10-04 DIAGNOSIS — Z23 Encounter for immunization: Secondary | ICD-10-CM | POA: Diagnosis not present

## 2022-10-04 DIAGNOSIS — R5383 Other fatigue: Secondary | ICD-10-CM | POA: Diagnosis not present

## 2022-10-04 DIAGNOSIS — M5430 Sciatica, unspecified side: Secondary | ICD-10-CM | POA: Diagnosis not present

## 2022-10-04 DIAGNOSIS — I10 Essential (primary) hypertension: Secondary | ICD-10-CM | POA: Diagnosis not present

## 2022-10-05 ENCOUNTER — Encounter: Payer: Medicare Other | Admitting: *Deleted

## 2022-11-05 DIAGNOSIS — J40 Bronchitis, not specified as acute or chronic: Secondary | ICD-10-CM | POA: Diagnosis not present

## 2022-11-11 DIAGNOSIS — R051 Acute cough: Secondary | ICD-10-CM | POA: Diagnosis not present

## 2022-11-11 DIAGNOSIS — J209 Acute bronchitis, unspecified: Secondary | ICD-10-CM | POA: Diagnosis not present

## 2023-01-13 DIAGNOSIS — Z23 Encounter for immunization: Secondary | ICD-10-CM | POA: Diagnosis not present

## 2023-03-28 DIAGNOSIS — E7849 Other hyperlipidemia: Secondary | ICD-10-CM | POA: Diagnosis not present

## 2023-03-28 DIAGNOSIS — K219 Gastro-esophageal reflux disease without esophagitis: Secondary | ICD-10-CM | POA: Diagnosis not present

## 2023-04-06 DIAGNOSIS — Z23 Encounter for immunization: Secondary | ICD-10-CM | POA: Diagnosis not present

## 2023-04-06 DIAGNOSIS — M5430 Sciatica, unspecified side: Secondary | ICD-10-CM | POA: Diagnosis not present

## 2023-04-06 DIAGNOSIS — R5383 Other fatigue: Secondary | ICD-10-CM | POA: Diagnosis not present

## 2023-04-06 DIAGNOSIS — E7849 Other hyperlipidemia: Secondary | ICD-10-CM | POA: Diagnosis not present

## 2023-04-06 DIAGNOSIS — I10 Essential (primary) hypertension: Secondary | ICD-10-CM | POA: Diagnosis not present

## 2023-04-06 DIAGNOSIS — Z1212 Encounter for screening for malignant neoplasm of rectum: Secondary | ICD-10-CM | POA: Diagnosis not present

## 2023-05-03 DIAGNOSIS — M19049 Primary osteoarthritis, unspecified hand: Secondary | ICD-10-CM | POA: Diagnosis not present

## 2023-05-03 DIAGNOSIS — M1611 Unilateral primary osteoarthritis, right hip: Secondary | ICD-10-CM | POA: Diagnosis not present

## 2023-05-03 DIAGNOSIS — M180 Bilateral primary osteoarthritis of first carpometacarpal joints: Secondary | ICD-10-CM | POA: Diagnosis not present

## 2023-05-12 DIAGNOSIS — J209 Acute bronchitis, unspecified: Secondary | ICD-10-CM | POA: Diagnosis not present

## 2023-05-17 DIAGNOSIS — R051 Acute cough: Secondary | ICD-10-CM | POA: Diagnosis not present

## 2023-05-17 DIAGNOSIS — R062 Wheezing: Secondary | ICD-10-CM | POA: Diagnosis not present

## 2023-06-07 DIAGNOSIS — M19049 Primary osteoarthritis, unspecified hand: Secondary | ICD-10-CM | POA: Diagnosis not present

## 2023-06-07 DIAGNOSIS — M180 Bilateral primary osteoarthritis of first carpometacarpal joints: Secondary | ICD-10-CM | POA: Diagnosis not present

## 2023-08-02 DIAGNOSIS — M19042 Primary osteoarthritis, left hand: Secondary | ICD-10-CM | POA: Diagnosis not present

## 2023-08-02 DIAGNOSIS — M180 Bilateral primary osteoarthritis of first carpometacarpal joints: Secondary | ICD-10-CM | POA: Diagnosis not present

## 2023-08-02 DIAGNOSIS — M19041 Primary osteoarthritis, right hand: Secondary | ICD-10-CM | POA: Diagnosis not present

## 2023-08-02 NOTE — Progress Notes (Signed)
 Orthopaedic Surgery Hand and Upper Extremity History and Physical Examination  CC: Follow-up bilateral hand pain  HPI 08/02/2023: History of Present Illness The patient is a 68 year old female presenting with painful small joints of the hands.  She reports morning stiffness and finds Celebrex  somewhat helpful. Referred to rheumatology and hand therapy in [place] at her last visit. Missed therapy session due to husband's fall and death but performs exercises at home, noting slight improvement and reduced swelling. Taking Celebrex  regularly with one month of supply left. Reports increase in size and flattening of right index finger knuckle, pain during driving, cooking, and lifting. Describes burning, stinging sensation in left index finger MCP joint, similar to a bee sting, without numbness. Resumed work involving hand movement.  MEDICATIONS Celebrex    Problem List:  Patient Active Problem List  Diagnosis  . Arthritis of carpometacarpal (CMC) joints of both thumbs  . Hand arthritis    Past Medical History: Past Medical History:  Diagnosis Date  . Arthritis   . Hypertension      Medications: Meds Ordered in [place]  Medication Sig Dispense Refill  . albuterol  HFA (PROVENTIL  HFA;VENTOLIN  HFA;PROAIR  HFA) 90 mcg/actuation inhaler     . ALPRAZolam  (XANAX ) 0.25 mg tablet alprazolam  0.25 mg tablet    . FLUoxetine (PROzac) 20 mg capsule     . lisinopriL (PRINIVIL) 20 mg tablet     . pantoprazole  (PROTONIX ) 40 mg EC tablet pantoprazole  40 mg tablet,delayed release     No current Epic-ordered facility-administered medications on file.    Allergies: Allergies as of 08/02/2023 - Reviewed 08/02/2023  Allergen Reaction Noted  . Penicillins Hives and Itching 09/03/2015  . Venom-honey bee Swelling 09/03/2015    Past Surgical History: Past Surgical History:  Procedure Laterality Date  . HYSTERECTOMY      Procedure: HYSTERECTOMY  . JOINT REPLACEMENT     Procedure: JOINT REPLACEMENT  .  KNEE SURGERY     Procedure: KNEE SURGERY  . RHINOPLASTY     Procedure: RHINOPLASTY  . TRIGGER FINGER RELEASE Left    Procedure: TRIGGER FINGER RELEASE; THUMB     Social History: Social History   Occupational History  . Not on file  Tobacco Use  . Smoking status: Never  . Smokeless tobacco: Never  Substance and Sexual Activity  . Alcohol use: Never  . Drug use: Never  . Sexual activity: Not on file     Family History: Family History  Problem Relation Name Age of Onset  . Arthritis Mother    . Cancer Father     Otherwise, no relevant orthopaedic family history  ROS: Review of Systems: All systems reviewed and are negative except that mentioned in HPI  Work/Sport/Hobbies: See HPI  Physical Examination: Vitals:   08/02/23 0847  BP: 145/83  Pulse: 68  Temp: 97.2 F (36.2 C)   Constitutional: Awake, alert.  WN/WD Appearance: healthy, no acute distress, well-groomed Affect: Normal HEENT: EOMI, mucous membranes moist CV: RRR Pulm: breathing comfortably  Bilateral upper Extremity / Hand Physical Exam Tenderness over right long finger MCP joint, lesser extent over PIP joint and index MCP joint. Bilateral shoulder deformity at thumb CMC joint. Negative Tinel's and Durkan's on right wrist.  Results: Results Positive ANA substrate with a titer of 1:160.    Assessment/Plan:  Assessment & Plan 1. Painful small joints of the hands: Chronic. Positive ANA substrate with a titer of 1-160. DIP joint of the right index finger with increased size. - Continue Celebrex  regimen. - Contact for refill  if necessary. - Persist with home therapy exercises. - If rheumatology rules out rheumatoid arthritis, medication management will transfer to primary care physician, who will oversee lab work.  Follow-up at end of August 2025.    Mary Sims. [place], MD Hand and Upper Extremity Surgery [facility] Department of Orthopaedic Surgery [facility] 08/02/2023 9:22 AM

## 2023-08-25 DIAGNOSIS — M79671 Pain in right foot: Secondary | ICD-10-CM | POA: Diagnosis not present

## 2023-09-27 DIAGNOSIS — I10 Essential (primary) hypertension: Secondary | ICD-10-CM | POA: Diagnosis not present

## 2023-09-27 DIAGNOSIS — Z0001 Encounter for general adult medical examination with abnormal findings: Secondary | ICD-10-CM | POA: Diagnosis not present

## 2023-09-27 DIAGNOSIS — R5383 Other fatigue: Secondary | ICD-10-CM | POA: Diagnosis not present

## 2023-09-27 DIAGNOSIS — E7849 Other hyperlipidemia: Secondary | ICD-10-CM | POA: Diagnosis not present

## 2023-10-04 DIAGNOSIS — E7849 Other hyperlipidemia: Secondary | ICD-10-CM | POA: Diagnosis not present

## 2023-10-04 DIAGNOSIS — E782 Mixed hyperlipidemia: Secondary | ICD-10-CM | POA: Diagnosis not present

## 2023-10-04 DIAGNOSIS — I10 Essential (primary) hypertension: Secondary | ICD-10-CM | POA: Diagnosis not present

## 2023-10-04 DIAGNOSIS — Z1389 Encounter for screening for other disorder: Secondary | ICD-10-CM | POA: Diagnosis not present

## 2023-10-04 DIAGNOSIS — Z0001 Encounter for general adult medical examination with abnormal findings: Secondary | ICD-10-CM | POA: Diagnosis not present

## 2023-11-02 NOTE — Progress Notes (Signed)
 Office Visit Note  Patient: Mary Sims             Date of Birth: 26-Oct-1955           MRN: [id number]             PCP: Atilano Deward ORN, MD Referring: Delene Blunt * Visit Date: 11/15/2023 Occupation: @GUAROCC @  Subjective:  Pain in hands and feet  History of Present Illness: Mary Sims is a 68 y.o. female seen for the evaluation of joint swelling and positive ANA.  According the patient her symptoms started about 5 years ago with pain in her both hands.  She was initially under care of Dr. Sissy and had CMC injections.  She has been under care of Dr. Delene for the last year.  She states she has had CMC injections by him also.  He noticed swelling in her right hand MCP joints and now obtained lab work which came positive for ANA for that reason she was referred to me.  She states she has been also having some discomfort in her feet.  She has noticed some swelling and discomfort in her right second toe.  She recalls having x-rays in July and was told that there was no fracture.  None of the other joints are painful.  She had right total hip replacement in May 2024 for osteoarthritis.  She states she has had hip pain for 3 to 4 years prior to that.  She also had left total knee replacement in January 2017 by Dr. Beverley for osteoarthritis which was in discomfort for 5 years prior to the surgery.  She states after the replacement she has done very well.  She denies any history of oral ulcers, nasal ulcers, malar rash, sicca symptoms, photosensitivity, Raynaud's or lymphadenopathy. There is no family history of autoimmune disease. 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  She enjoys playing piano, and cooking.  She swims and walks for exercise.  She also have many stairs in her house which she takes several times a day.  She is married, gravida 2, para 2.  There is no history of preeclampsia or DVTs.  She drinks alcohol only occasionally.  She has always  been a non-smoker.    Activities of Daily Living:  Patient reports morning stiffness for 10 minutes.   Patient Reports nocturnal pain.  Difficulty dressing/grooming: Reports Difficulty climbing stairs: Denies Difficulty getting out of chair: Denies Difficulty using hands for taps, buttons, cutlery, and/or writing: Reports  Review of Systems  Constitutional:  Negative for fatigue.  HENT:  Negative for mouth sores and mouth dryness.   Eyes:  Negative for dryness.  Respiratory:  Negative for shortness of breath.   Cardiovascular:  Negative for chest pain and palpitations.  Gastrointestinal:  Negative for blood in stool, constipation and diarrhea.  Endocrine: Negative for increased urination.  Genitourinary:  Negative for involuntary urination.  Musculoskeletal:  Positive for joint pain, gait problem, joint pain, joint swelling and morning stiffness. Negative for myalgias, muscle weakness, muscle tenderness and myalgias.  Skin:  Negative for color change, rash, hair loss and sensitivity to sunlight.  Allergic/Immunologic: Negative for susceptible to infections.  Neurological:  Negative for dizziness and headaches.  Hematological:  Negative for swollen glands.  Psychiatric/Behavioral:  Positive for depressed mood and sleep disturbance. The patient is nervous/anxious.     PMFS History:  Patient Active Problem List   Diagnosis Date Noted   S/P total knee replacement 09/17/2015  Past Medical History:  Diagnosis Date   Anxiety    Arthritis    elbows, hands, knees, thumbs (09/17/2015)   Chronic bronchitis (HCC)    Complication of anesthesia    slow to wake up after rhinoplasty   Frequent UTI    1-2 times/year; every year (09/17/2015)   GERD (gastroesophageal reflux disease)    PONV (postoperative nausea and vomiting)    Walking pneumonia 2004    Family History  Problem Relation Age of Onset   GER disease Mother    Alcoholism Father    Lung cancer Father    Allergies Son     Thyroid disease Daughter    Past Surgical History:  Procedure Laterality Date   CATARACT EXTRACTION W/ INTRAOCULAR LENS  IMPLANT, BILATERAL Bilateral 2000s   DILATION AND CURETTAGE OF UTERUS  03/29/1997   RHINOPLASTY  03/29/2006   TOTAL HIP ARTHROPLASTY Right 2024   TOTAL KNEE ARTHROPLASTY Left 09/17/2015   TOTAL KNEE ARTHROPLASTY Left 09/17/2015   Procedure: TOTAL KNEE ARTHROPLASTY;  Surgeon: Toribio JULIANNA Chancy, MD;  Location: [facility] OR;  Service: Orthopedics;  Laterality: Left;   TRIGGER FINGER RELEASE Left ~ 2003   thumb   TUBAL LIGATION  03/30/1979   VAGINAL HYSTERECTOMY  03/29/1997   Social History   Social History Narrative   Not on file   Immunization History  Administered Date(s) Administered   Influenza,inj,Quad PF,6+ Mos 01/01/2014, 01/09/2015, 01/25/2017     Objective: Vital Signs: BP (!) 143/87 (BP Location: Right Arm, Patient Position: Sitting, Cuff Size: Normal)   Pulse 69   Resp 16   Ht 5' 0.75 (1.543 m)   Wt 187 lb 9.6 oz (85.1 kg)   BMI 35.74 kg/m    Physical Exam Vitals and nursing note reviewed.  Constitutional:      Appearance: She is well-developed.  HENT:     Head: Normocephalic and atraumatic.  Eyes:     Conjunctiva/sclera: Conjunctivae normal.  Cardiovascular:     Rate and Rhythm: Normal rate and regular rhythm.     Heart sounds: Normal heart sounds.  Pulmonary:     Effort: Pulmonary effort is normal.     Breath sounds: Normal breath sounds.  Abdominal:     General: Bowel sounds are normal.     Palpations: Abdomen is soft.  Musculoskeletal:     Cervical back: Normal range of motion.  Lymphadenopathy:     Cervical: No cervical adenopathy.  Skin:    General: Skin is warm and dry.     Capillary Refill: Capillary refill takes less than 2 seconds.  Neurological:     Mental Status: She is alert and oriented to person, place, and time.  Psychiatric:        Behavior: Behavior normal.      Musculoskeletal Exam: Cervical spine was in good  range of motion.  Thoracic and lumbar spine were in good range of motion.  She had no SI joint tenderness.  Shoulders, elbows and wrist joints were in good range of motion.  She had bilateral CMC thickening and subluxation.  She had thickening of the right third MCP joint without any synovitis.  She had bilateral trigger thumb.  There was thickening of the right middle flexor tendon.  Hip joints were in good range of motion.  Right hip joint was replaced.  Knee joints were in good range of motion without any warmth swelling or effusion.  Left knee joint was replaced.  There was no tenderness over her ankles.  There  was mild tenderness over right second MTP joint.  PIP and DIP thickening was noted.  CDAI Exam: CDAI Score: -- Patient Global: --; Provider Global: -- Swollen: --; Tender: -- Joint Exam 11/15/2023   No joint exam has been documented for this visit   There is currently no information documented on the homunculus. Go to the Rheumatology activity and complete the homunculus joint exam.  Investigation: No additional findings.  Imaging: XR Foot 2 Views Left Result Date: 11/15/2023 Severe first MTP narrowing, PIP and DIP narrowing was noted.  Intertarsal narrowing was noted.  No tibiotalar or subtalar joint space narrowing was noted.  Inferior and posterior calcaneal spurs were noted.  No erosive changes were noted. Impression: These findings are suggestive of osteoarthritis of the foot.  Cannot  XR Foot 2 Views Right Result Date: 11/15/2023 Severe narrowing of first MTP joint with cystic changes been noted.  Second MTP narrowing was noted.  PIP and DIP narrowing was noted.  Dorsal spurring was noted.  No intertarsal, tibiotalar or subtalar joint space narrowing was noted.  Inferior and posterior calcaneal spurs were noted. Impression: These findings suggestive of osteoarthritis of the foot.  XR Hand 2 View Left Result Date: 11/15/2023 Severe CMC narrowing and subluxation with spurring  was noted.  PIP and DIP narrowing was noted.  No intercarpal or radiocarpal joint space narrowing was noted.  No erosive changes were noted. Impression: These findings suggestive of osteoarthritis of the hand.  XR Hand 2 View Right Result Date: 11/15/2023 Generalized osteopenia was noted.  CMC narrowing and subluxation was noted.  1st, 2nd and 3rd MCP narrowing was noted.  PIP and DIP and PIP narrowing was noted.  Erosive changes were noted in the first DIP joint.  No intercarpal or radiocarpal joint space narrowing was noted. Impression: These findings are suggestive of osteoarthritis of the hand.  However MCP narrowing raises the concern of inflammatory arthritis or crystal induced arthropathy.   Recent Labs: Lab Results  Component Value Date   WBC 12.7 (H) 09/18/2015   HGB 12.0 09/18/2015   PLT 287 09/18/2015   NA 139 09/18/2015   K 3.6 09/18/2015   CL 106 09/18/2015   CO2 26 09/18/2015   GLUCOSE 129 (H) 09/18/2015   BUN 8 09/18/2015   CREATININE 0.77 09/18/2015   CALCIUM 9.2 09/18/2015   GFRAA >60 09/18/2015   May 03, 2023 RF 7, anti-CCP 6, ANA 1: 160 NH  Speciality Comments: No specialty comments available.  Procedures:  No procedures performed Allergies: Bee venom and Penicillins   Assessment / Plan:     Visit Diagnoses: Positive ANA (antinuclear antibody) -patient has low titer positive ANA.  She denies any history of oral ulcers, nasal ulcers, malar rash, full sensitivity, Raynaud's or lymphadenopathy.  We had a detailed discussion regarding significance of ANA and its presence in normal population.  I will obtain additional labs today.  Plan: CBC with Differential/Platelet, Comprehensive metabolic panel with GFR, RNP Antibody, Anti-Smith antibody, Sjogrens syndrome-A extractable nuclear antibody, Sjogrens syndrome-B extractable nuclear antibody, C3 and C4, Anti-DNA antibody, double-stranded  Pain in both hands -she complains of pain and discomfort in her bilateral hands.   She has been under care of hand surgery for several years.  She has not noticed any joint swelling.  Although she continues to have discomfort in her bilateral thumb and also her right third MCP joint.  No synovitis was noted.  Thickening of right third MCP joint was noted.  Plan: XR Hand 2 View  Right, XR Hand 2 View Left.  X-rays obtained today showed generalized osteopenia and severe osteoarthritic changes with bilateral CMC subluxation.  Right third MCP joint narrowing was noted.  The differential diagnosis would include osteoarthritis, crystal induced arthropathy or rheumatoid arthritis.  However I did not see any synovitis on the examination.  Joint protection muscle strengthening was discussed.  Pain in both feet -she complains of discomfort in her bilateral feet especially her right second MTP joint.  X-rays showed narrowing of the right second MTP joint.  No erosive changes were noted.  X-ray findings were suggestive of osteoarthritis.  Plan: XR Foot 2 Views Right, XR Foot 2 Views Left, Sedimentation rate, Uric acid, Magnesium, Parathyroid  hormone, intact (no Ca),  Arthritis of carpometacarpal (CMC) joints of both thumbs-she has severe CMC arthritis and subluxation.  She has had injections at the hand center for the last several years.  She has also had trigger thumb injections.  Status post total hip replacement, right - 2024 by Dr. Carmelita.  She had good range of motion without any discomfort.  Status post total left knee replacement - 04/19/2015 by Dr. Beverley.  She good range of motion without discomfort.  Primary hypertension-blood pressure was elevated at 146/94.  Repeat blood pressure was 143/87.  She was advised to monitor blood pressure closely.  Other medical problems are listed as follows:  Gastroesophageal reflux disease without esophagitis  Anxiety and depression  Status post hysterectomy  Orders: Orders Placed This Encounter  Procedures   XR Hand 2 View Right   XR Hand  2 View Left   XR Foot 2 Views Right   XR Foot 2 Views Left   CBC with Differential/Platelet   Comprehensive metabolic panel with GFR   Sedimentation rate   RNP Antibody   Anti-Smith antibody   Sjogrens syndrome-A extractable nuclear antibody   Sjogrens syndrome-B extractable nuclear antibody   C3 and C4   Anti-DNA antibody, double-stranded   Uric acid   Magnesium   Parathyroid  hormone, intact (no Ca)   No orders of the defined types were placed in this encounter.    Follow-Up Instructions: Return for Pain in joints.   Maya Nash, MD  Note - This record has been created using Animal nutritionist.  Chart creation errors have been sought, but may not always  have been located. Such creation errors do not reflect on  the standard of medical care.

## 2023-11-10 DIAGNOSIS — M19041 Primary osteoarthritis, right hand: Secondary | ICD-10-CM | POA: Diagnosis not present

## 2023-11-10 DIAGNOSIS — M65311 Trigger thumb, right thumb: Secondary | ICD-10-CM | POA: Diagnosis not present

## 2023-11-10 DIAGNOSIS — M180 Bilateral primary osteoarthritis of first carpometacarpal joints: Secondary | ICD-10-CM | POA: Diagnosis not present

## 2023-11-10 DIAGNOSIS — M19042 Primary osteoarthritis, left hand: Secondary | ICD-10-CM | POA: Diagnosis not present

## 2023-11-15 ENCOUNTER — Ambulatory Visit (INDEPENDENT_AMBULATORY_CARE_PROVIDER_SITE_OTHER)

## 2023-11-15 ENCOUNTER — Ambulatory Visit: Attending: Rheumatology | Admitting: Rheumatology

## 2023-11-15 ENCOUNTER — Encounter: Payer: Self-pay | Admitting: Rheumatology

## 2023-11-15 ENCOUNTER — Ambulatory Visit

## 2023-11-15 VITALS — BP 143/87 | HR 69 | Resp 16 | Ht 60.75 in | Wt 187.6 lb

## 2023-11-15 DIAGNOSIS — F419 Anxiety disorder, unspecified: Secondary | ICD-10-CM

## 2023-11-15 DIAGNOSIS — M79672 Pain in left foot: Secondary | ICD-10-CM

## 2023-11-15 DIAGNOSIS — I10 Essential (primary) hypertension: Secondary | ICD-10-CM

## 2023-11-15 DIAGNOSIS — M79641 Pain in right hand: Secondary | ICD-10-CM

## 2023-11-15 DIAGNOSIS — K219 Gastro-esophageal reflux disease without esophagitis: Secondary | ICD-10-CM | POA: Diagnosis not present

## 2023-11-15 DIAGNOSIS — M79642 Pain in left hand: Secondary | ICD-10-CM

## 2023-11-15 DIAGNOSIS — M79671 Pain in right foot: Secondary | ICD-10-CM

## 2023-11-15 DIAGNOSIS — R768 Other specified abnormal immunological findings in serum: Secondary | ICD-10-CM

## 2023-11-15 DIAGNOSIS — F32A Depression, unspecified: Secondary | ICD-10-CM

## 2023-11-15 DIAGNOSIS — Z96641 Presence of right artificial hip joint: Secondary | ICD-10-CM

## 2023-11-15 DIAGNOSIS — Z90710 Acquired absence of both cervix and uterus: Secondary | ICD-10-CM

## 2023-11-15 DIAGNOSIS — M180 Bilateral primary osteoarthritis of first carpometacarpal joints: Secondary | ICD-10-CM

## 2023-11-15 DIAGNOSIS — Z96653 Presence of artificial knee joint, bilateral: Secondary | ICD-10-CM

## 2023-11-15 DIAGNOSIS — Z96652 Presence of left artificial knee joint: Secondary | ICD-10-CM | POA: Diagnosis not present

## 2023-11-15 NOTE — Patient Instructions (Signed)

## 2023-11-16 LAB — CBC WITH DIFFERENTIAL/PLATELET
Absolute Lymphocytes: 1224 {cells}/uL (ref 850–3900)
Absolute Monocytes: 480 {cells}/uL (ref 200–950)
Basophils Absolute: 42 {cells}/uL (ref 0–200)
Basophils Relative: 0.7 %
Eosinophils Absolute: 150 {cells}/uL (ref 15–500)
Eosinophils Relative: 2.5 %
HCT: 42.1 % (ref 35.0–45.0)
Hemoglobin: 13.5 g/dL (ref 11.7–15.5)
MCH: 32 pg (ref 27.0–33.0)
MCHC: 32.1 g/dL (ref 32.0–36.0)
MCV: 99.8 fL (ref 80.0–100.0)
MPV: 9.5 fL (ref 7.5–12.5)
Monocytes Relative: 8 %
Neutro Abs: 4104 {cells}/uL (ref 1500–7800)
Neutrophils Relative %: 68.4 %
Platelets: 312 Thousand/uL (ref 140–400)
RBC: 4.22 Million/uL (ref 3.80–5.10)
RDW: 12.5 % (ref 11.0–15.0)
Total Lymphocyte: 20.4 %
WBC: 6 Thousand/uL (ref 3.8–10.8)

## 2023-11-16 LAB — ANTI-DNA ANTIBODY, DOUBLE-STRANDED: ds DNA Ab: <1 [IU]/mL

## 2023-11-16 LAB — SJOGRENS SYNDROME-A EXTRACTABLE NUCLEAR ANTIBODY: SSA (Ro) (ENA) Antibody, IgG: <1 AI

## 2023-11-16 LAB — COMPREHENSIVE METABOLIC PANEL WITH GFR
AG Ratio: 1.8 (calc) (ref 1.0–2.5)
ALT: 14 U/L (ref 6–29)
AST: 14 U/L (ref 10–35)
Albumin: 4.2 g/dL (ref 3.6–5.1)
Alkaline phosphatase (APISO): 83 U/L (ref 37–153)
BUN: 16 mg/dL (ref 7–25)
CO2: 26 mmol/L (ref 20–32)
Calcium: 9.3 mg/dL (ref 8.6–10.4)
Chloride: 106 mmol/L (ref 98–110)
Creat: 0.79 mg/dL (ref 0.50–1.05)
Globulin: 2.3 g/dL (ref 1.9–3.7)
Glucose, Bld: 89 mg/dL (ref 65–99)
Potassium: 4.7 mmol/L (ref 3.5–5.3)
Sodium: 142 mmol/L (ref 135–146)
Total Bilirubin: 0.3 mg/dL (ref 0.2–1.2)
Total Protein: 6.5 g/dL (ref 6.1–8.1)
eGFR: 81 mL/min/1.73m2 (ref >=60)

## 2023-11-16 LAB — URIC ACID: Uric Acid, Serum: 4.6 mg/dL (ref 2.5–7.0)

## 2023-11-16 LAB — MAGNESIUM: Magnesium: 2.1 mg/dL (ref 1.5–2.5)

## 2023-11-16 LAB — C3 AND C4
C3 Complement: 139 mg/dL (ref 83–193)
C4 Complement: 25 mg/dL (ref 15–57)

## 2023-11-16 LAB — SEDIMENTATION RATE: Sed Rate: 19 mm/h (ref 0–30)

## 2023-11-16 LAB — RNP ANTIBODY: Ribonucleic Protein(ENA) Antibody, IgG: <1 AI

## 2023-11-16 LAB — PARATHYROID HORMONE, INTACT (NO CA): PTH: 43 pg/mL (ref 16–77)

## 2023-11-16 LAB — ANTI-SMITH ANTIBODY: ENA SM Ab Ser-aCnc: <1 AI

## 2023-11-16 LAB — SJOGRENS SYNDROME-B EXTRACTABLE NUCLEAR ANTIBODY: SSB (La) (ENA) Antibody, IgG: <1 AI

## 2023-11-17 ENCOUNTER — Ambulatory Visit: Payer: Self-pay | Admitting: Rheumatology

## 2023-11-17 NOTE — Progress Notes (Signed)
All the labs are within normal limits.  I will discuss results at the follow-up visit.

## 2023-12-06 NOTE — Progress Notes (Signed)
 Office Visit Note  Patient: Mary Sims             Date of Birth: 1956/03/22           MRN: [id number]             PCP: Atilano Deward ORN, MD Referring: Atilano Deward ORN, MD Visit Date: 12/20/2023 Occupation: @GUAROCC @  Subjective:  Pain in hands and feet  History of Present Illness: Mary Sims is a 68 y.o. female with osteoarthritis.  She returns today after her initial visit on November 15, 2023.  She states she had a good response to right trigger thumb injection at Dr. Berton office.  She continues to have discomfort in her hands especially her right hand which she describes in the right 2nd, 3rd and 4th knuckles.  She notices some swelling in those joints.  Continues to have discomfort in her right first MTP joint.  I replaced stents doing well.    Activities of Daily Living:  Patient reports morning stiffness for 10-15 minutes.   Patient Reports nocturnal pain.  Difficulty dressing/grooming: Denies Difficulty climbing stairs: Denies Difficulty getting out of chair: Denies Difficulty using hands for taps, buttons, cutlery, and/or writing: Reports  Review of Systems  Constitutional:  Negative for fatigue.  HENT:  Negative for mouth sores and mouth dryness.   Eyes:  Negative for dryness.  Respiratory:  Negative for shortness of breath.   Cardiovascular:  Negative for chest pain and palpitations.  Gastrointestinal:  Negative for blood in stool, constipation and diarrhea.  Endocrine: Negative for increased urination.  Genitourinary:  Negative for involuntary urination.  Musculoskeletal:  Positive for joint pain, joint pain, joint swelling, muscle weakness and morning stiffness. Negative for gait problem, myalgias, muscle tenderness and myalgias.  Skin:  Negative for color change, rash, hair loss and sensitivity to sunlight.  Allergic/Immunologic: Negative for susceptible to infections.  Neurological:  Negative for dizziness and headaches.  Hematological:  Negative for  swollen glands.  Psychiatric/Behavioral:  Positive for depressed mood. Negative for sleep disturbance. The patient is nervous/anxious.     PMFS History:  Patient Active Problem List   Diagnosis Date Noted   Arthritis of carpometacarpal (CMC) joints of both thumbs 12/20/2023   Primary osteoarthritis of both feet 12/20/2023   Primary hypertension 12/20/2023   Gastroesophageal reflux disease without esophagitis 12/20/2023   Anxiety and depression 12/20/2023   S/P total knee replacement 09/17/2015    Past Medical History:  Diagnosis Date   Anxiety    Arthritis    elbows, hands, knees, thumbs (09/17/2015)   Chronic bronchitis (HCC)    Complication of anesthesia    slow to wake up after rhinoplasty   Frequent UTI    1-2 times/year; every year (09/17/2015)   GERD (gastroesophageal reflux disease)    PONV (postoperative nausea and vomiting)    Walking pneumonia 2004    Family History  Problem Relation Age of Onset   GER disease Mother    Alcoholism Father    Lung cancer Father    Allergies Son    Thyroid disease Daughter    Past Surgical History:  Procedure Laterality Date   CATARACT EXTRACTION W/ INTRAOCULAR LENS  IMPLANT, BILATERAL Bilateral 2000s   DILATION AND CURETTAGE OF UTERUS  03/29/1997   RHINOPLASTY  03/29/2006   TOTAL HIP ARTHROPLASTY Right 2024   TOTAL KNEE ARTHROPLASTY Left 09/17/2015   TOTAL KNEE ARTHROPLASTY Left 09/17/2015   Procedure: TOTAL KNEE ARTHROPLASTY;  Surgeon: Toribio JULIANNA Chancy, MD;  Location:  [facility] OR;  Service: Orthopedics;  Laterality: Left;   TRIGGER FINGER RELEASE Left ~ 2003   thumb   TUBAL LIGATION  03/30/1979   VAGINAL HYSTERECTOMY  03/29/1997   Social History   Tobacco Use   Smoking status: Never    Passive exposure: Past   Smokeless tobacco: Never  Vaping Use   Vaping status: Never Used  Substance Use Topics   Alcohol use: Yes    Comment: 09/17/2015 (maybe  2 drinks a year   Drug use: No   Social History   Social History  Narrative   Not on file     Immunization History  Administered Date(s) Administered   Influenza,inj,Quad PF,6+ Mos 01/01/2014, 01/09/2015, 01/25/2017     Objective: Vital Signs: BP 137/87   Pulse 71   Temp 98 F (36.7 C)   Resp 15   Ht 5' 3 (1.6 m)   Wt 189 lb 6.4 oz (85.9 kg)   BMI 33.55 kg/m    Physical Exam Vitals and nursing note reviewed.  Constitutional:      Appearance: She is well-developed.  HENT:     Head: Normocephalic and atraumatic.  Eyes:     Conjunctiva/sclera: Conjunctivae normal.  Cardiovascular:     Rate and Rhythm: Normal rate and regular rhythm.     Heart sounds: Normal heart sounds.  Pulmonary:     Effort: Pulmonary effort is normal.     Breath sounds: Normal breath sounds.  Abdominal:     General: Bowel sounds are normal.     Palpations: Abdomen is soft.  Musculoskeletal:     Cervical back: Normal range of motion.  Lymphadenopathy:     Cervical: No cervical adenopathy.  Skin:    General: Skin is warm and dry.     Capillary Refill: Capillary refill takes less than 2 seconds.  Neurological:     Mental Status: She is alert and oriented to person, place, and time.  Psychiatric:        Behavior: Behavior normal.      Musculoskeletal Exam: She had good range of motion of the cervical, thoracic and lumbar spine.  Shoulders, elbows, wrist joints with good range of motion.  She had bilateral CMC thickening and subluxation.  She has thickening of the right third MCP joint with no synovitis.  Hip joints with good range of motion and the right hip joint was replaced.  Knee joints with good range of motion and the left knee joint was replaced.  There was no tenderness over her ankles.  She had tenderness over right first MTP joint with no synovitis.  PIP and DIP thickening was noted.  CDAI Exam: CDAI Score: -- Patient Global: --; Provider Global: -- Swollen: --; Tender: -- Joint Exam 12/20/2023   No joint exam has been documented for this visit    There is currently no information documented on the homunculus. Go to the Rheumatology activity and complete the homunculus joint exam.  Investigation: No additional findings.  Imaging: No results found.   Recent Labs: Lab Results  Component Value Date   WBC 6.0 11/15/2023   HGB 13.5 11/15/2023   PLT 312 11/15/2023   NA 142 11/15/2023   K 4.7 11/15/2023   CL 106 11/15/2023   CO2 26 11/15/2023   GLUCOSE 89 11/15/2023   BUN 16 11/15/2023   CREATININE 0.79 11/15/2023   BILITOT 0.3 11/15/2023   AST 14 11/15/2023   ALT 14 11/15/2023   PROT 6.5 11/15/2023   CALCIUM 9.3  11/15/2023   GFRAA >60 09/18/2015   November 15, 2023 RNP negative, Claudene negative, SSA negative, SSB negative, dsDNA negative, C3 and C4 normal, sed rate 19, uric acid 4.6, magnesium normal, PTH normal  ebruary 4, 2025 RF 7, anti-CCP 6, ANA 1: 160 NH   Speciality Comments: No specialty comments available.  Procedures:  No procedures performed Allergies: Bee venom and Penicillins   Assessment / Plan:     Visit Diagnoses: Positive ANA (antinuclear antibody)-ANA was low titer positive, ENA negative, complements normal.  Lab results were reviewed with the patient.  She has no clinical features of systemic lupus or related disease.  I advised her to contact me if she develops any new symptoms.  Pain in both hands - History of pain and both thumbs and right third MCP joint.  X-ray B CMC subluxation, severe OA changes, generalized osteopenia and right third MCP narrowing.  X-ray findings were reviewed with the patient.  Joint protection muscle strengthening was discussed.  A handout on hand exercises was given.  I will schedule ultrasound of bilateral hands to look for synovitis.  Patient had a right thumb trigger finger injection by hand surgery which helped.  Natural anti-inflammatories were discussed.  Arthritis of carpometacarpal (CMC) joints of both thumbs-benefit of using a CMC brace was discussed.  Status  post total hip replacement, right-doing well.  Status post total left knee replacement-doing well.  Primary osteoarthritis of both feet-x-rays of bilateral feet were reviewed with the patient.  She had bilateral first MTP narrowing and PIP and DIP narrowing suggestive of osteoarthritis.  Feet exercises were discussed.  Use of arch support and metatarsal pads were discussed.  Primary hypertension-blood pressure was normal at 137/87.  Other medical problems are listed as follows:  Gastroesophageal reflux disease without esophagitis  Anxiety and depression  Status post hysterectomy  Orders: No orders of the defined types were placed in this encounter.  No orders of the defined types were placed in this encounter.    Follow-Up Instructions: Return in about 3 months (around 03/20/2024) for Osteoarthritis.   Maya Nash, MD  Note - This record has been created using Animal nutritionist.  Chart creation errors have been sought, but may not always  have been located. Such creation errors do not reflect on  the standard of medical care.

## 2023-12-20 ENCOUNTER — Ambulatory Visit: Attending: Rheumatology | Admitting: Rheumatology

## 2023-12-20 ENCOUNTER — Encounter: Payer: Self-pay | Admitting: Rheumatology

## 2023-12-20 VITALS — BP 137/87 | HR 71 | Temp 98.0°F | Resp 15 | Ht 63.0 in | Wt 189.4 lb

## 2023-12-20 DIAGNOSIS — M79641 Pain in right hand: Secondary | ICD-10-CM

## 2023-12-20 DIAGNOSIS — R768 Other specified abnormal immunological findings in serum: Secondary | ICD-10-CM | POA: Diagnosis not present

## 2023-12-20 DIAGNOSIS — Z90710 Acquired absence of both cervix and uterus: Secondary | ICD-10-CM

## 2023-12-20 DIAGNOSIS — M19071 Primary osteoarthritis, right ankle and foot: Secondary | ICD-10-CM | POA: Diagnosis not present

## 2023-12-20 DIAGNOSIS — F419 Anxiety disorder, unspecified: Secondary | ICD-10-CM | POA: Insufficient documentation

## 2023-12-20 DIAGNOSIS — M180 Bilateral primary osteoarthritis of first carpometacarpal joints: Secondary | ICD-10-CM | POA: Insufficient documentation

## 2023-12-20 DIAGNOSIS — Z96641 Presence of right artificial hip joint: Secondary | ICD-10-CM | POA: Diagnosis not present

## 2023-12-20 DIAGNOSIS — I10 Essential (primary) hypertension: Secondary | ICD-10-CM | POA: Diagnosis not present

## 2023-12-20 DIAGNOSIS — Z96652 Presence of left artificial knee joint: Secondary | ICD-10-CM

## 2023-12-20 DIAGNOSIS — K219 Gastro-esophageal reflux disease without esophagitis: Secondary | ICD-10-CM | POA: Diagnosis not present

## 2023-12-20 DIAGNOSIS — M79642 Pain in left hand: Secondary | ICD-10-CM | POA: Diagnosis not present

## 2023-12-20 DIAGNOSIS — M19072 Primary osteoarthritis, left ankle and foot: Secondary | ICD-10-CM | POA: Insufficient documentation

## 2023-12-20 DIAGNOSIS — F32A Depression, unspecified: Secondary | ICD-10-CM

## 2023-12-20 NOTE — Patient Instructions (Signed)

## 2023-12-27 DIAGNOSIS — M65331 Trigger finger, right middle finger: Secondary | ICD-10-CM | POA: Diagnosis not present

## 2023-12-27 DIAGNOSIS — M1811 Unilateral primary osteoarthritis of first carpometacarpal joint, right hand: Secondary | ICD-10-CM | POA: Diagnosis not present

## 2023-12-27 DIAGNOSIS — M720 Palmar fascial fibromatosis [Dupuytren]: Secondary | ICD-10-CM | POA: Diagnosis not present

## 2023-12-27 DIAGNOSIS — M65311 Trigger thumb, right thumb: Secondary | ICD-10-CM | POA: Diagnosis not present

## 2024-01-26 ENCOUNTER — Ambulatory Visit

## 2024-01-26 ENCOUNTER — Ambulatory Visit: Attending: Rheumatology | Admitting: Rheumatology

## 2024-01-26 DIAGNOSIS — M180 Bilateral primary osteoarthritis of first carpometacarpal joints: Secondary | ICD-10-CM

## 2024-01-26 DIAGNOSIS — M79641 Pain in right hand: Secondary | ICD-10-CM | POA: Diagnosis not present

## 2024-01-26 DIAGNOSIS — M79642 Pain in left hand: Secondary | ICD-10-CM | POA: Diagnosis not present

## 2024-01-26 DIAGNOSIS — R7689 Other specified abnormal immunological findings in serum: Secondary | ICD-10-CM

## 2024-01-26 NOTE — Progress Notes (Signed)
 Visit diagnosis: Pain in both hands, positive ANA Patient was here to get ultrasound of bilateral hands to look for synovitis.  Limited ultrasound examination of bilateral hands was performed per EULAR recommendations. Using 15 MHz transducer, grayscale and power Doppler bilateral second, and third MCP joints both dorsal and volar aspects were evaluated to look for synovitis or tenosynovitis. The findings were there was no synovitis or tenosynovitis on ultrasound examination.  Mild Doppler activity was noted in the right third MCP joint.  Joint space narrowing was noted of all the examined MCP joints.   Impression: No synovitis or tenosynovitis was noted on the limited ultrasound examination of the hands.  Ultrasound results were discussed with the patient.  Mild Doppler activity was noted in the right third MCP joint.  Narrowing of the right third MCP joint and mild narrowing of the other MCP joints was noted.  I discussed possibility of crystal induced arthropathy or inflammatory arthritis.  Patient voiced understanding.  She is trying natural anti-inflammatories for now.  Maya Nash, MD

## 2024-02-28 NOTE — Progress Notes (Unsigned)
 Office Visit Note  Patient: Mary Sims             Date of Birth: [birth year]           MRN: [id number]             PCP: Mary Deward ORN, MD Referring: Mary Deward ORN, MD Visit Date: 03/13/2024 Occupation: Data Unavailable  Subjective:  Pain in both hands  History of Present Illness: Mary Sims is a 68 y.o. female with history of positive ANA and osteoarthritis.  Patient continues to have chronic pain and stiffness affecting both hands.  Her symptoms are most severe affecting the right hand.  She has had more difficulty with functionality of the right hand and has noticed decreased grip strength at times.  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  Patient states that after repetitive overuse activities she experiences an exacerbation of pain.  Patient remains under the care of the hand center.  She will be following back up on 04/03/2024 to discuss possible surgical intervention.  She continues to have locking and tenderness of the right thumb due to a trigger thumb.  She also has a Dupuytren's contracture and ongoing pain affecting the right 2nd, 3rd, and 4th MCP and PIP joints.  Patient is been taking Tylenol  1-2 times per day for pain relief and was also started on meloxicam 15 mg 1 tablet daily a few weeks ago.  She has noticed less nocturnal pain since initiating meloxicam at bedtime but continues to have symptoms throughout the day.  She is considering taking meloxicam in the morning instead of at bedtime. Patient states her right hip replacement and left knee replacement are doing well.  Activities of Daily Living:  Patient reports morning stiffness for 5 minutes.   Patient Reports nocturnal pain.  Difficulty dressing/grooming: Reports Difficulty climbing stairs: Denies Difficulty getting out of chair: Denies Difficulty using hands for taps, buttons, cutlery, and/or writing: Reports  Review of Systems  Constitutional:  Positive for fatigue.  HENT:   Positive for mouth dryness. Negative for mouth sores.   Eyes:  Positive for dryness.  Respiratory:  Negative for shortness of breath.   Cardiovascular:  Negative for chest pain and palpitations.  Gastrointestinal:  Negative for blood in stool, constipation and diarrhea.  Endocrine: Negative for increased urination.  Genitourinary:  Negative for involuntary urination.  Musculoskeletal:  Positive for joint pain, joint pain, joint swelling and morning stiffness. Negative for gait problem, myalgias, muscle weakness, muscle tenderness and myalgias.  Skin:  Negative for color change, rash, hair loss and sensitivity to sunlight.  Allergic/Immunologic: Negative for susceptible to infections.  Neurological:  Negative for dizziness and headaches.  Hematological:  Negative for swollen glands.  Psychiatric/Behavioral:  Positive for depressed mood and sleep disturbance. The patient is nervous/anxious.     PMFS History:  Patient Active Problem List   Diagnosis Date Noted   Arthritis of carpometacarpal (CMC) joints of both thumbs 12/20/2023   Primary osteoarthritis of both feet 12/20/2023   Primary hypertension 12/20/2023   Gastroesophageal reflux disease without esophagitis 12/20/2023   Anxiety and depression 12/20/2023   S/P total knee replacement 09/17/2015    Past Medical History:  Diagnosis Date   Anxiety    Arthritis    elbows, hands, knees, thumbs (09/17/2015)   Chronic bronchitis (HCC)    Complication of anesthesia    slow to wake up after rhinoplasty   Frequent UTI    1-2 times/year; every  year (09/17/2015)   GERD (gastroesophageal reflux disease)    PONV (postoperative nausea and vomiting)    Walking pneumonia 2004    Family History  Problem Relation Age of Onset   GER disease Mother    Alcoholism Father    Lung cancer Father    Allergies Son    Thyroid disease Daughter    Past Surgical History:  Procedure Laterality Date   CATARACT EXTRACTION W/ INTRAOCULAR LENS   IMPLANT, BILATERAL Bilateral 2000s   DILATION AND CURETTAGE OF UTERUS  03/29/1997   RHINOPLASTY  03/29/2006   TOTAL HIP ARTHROPLASTY Right 2024   TOTAL KNEE ARTHROPLASTY Left 09/17/2015   TOTAL KNEE ARTHROPLASTY Left 09/17/2015   Procedure: TOTAL KNEE ARTHROPLASTY;  Surgeon: Mary JULIANNA Chancy, MD;  Location: [facility] OR;  Service: Orthopedics;  Laterality: Left;   TRIGGER FINGER RELEASE Left ~ 2003   thumb   TUBAL LIGATION  03/30/1979   VAGINAL HYSTERECTOMY  03/29/1997   Social History   Tobacco Use   Smoking status: Never    Passive exposure: Past   Smokeless tobacco: Never  Vaping Use   Vaping status: Never Used  Substance Use Topics   Alcohol use: Yes    Comment: 09/17/2015 (maybe  2 drinks a year   Drug use: No   Social History   Social History Narrative   Not on file     Immunization History  Administered Date(s) Administered   Influenza,inj,Quad PF,6+ Mos 01/01/2014, 01/09/2015, 01/25/2017     Objective: Vital Signs: BP (!) 161/96   Pulse 73   Temp (!) 97.2 F (36.2 C)   Resp 12   Ht 5' 3 (1.6 m)   Wt 196 lb 3.2 oz (89 kg)   BMI 34.76 kg/m    Physical Exam Vitals and nursing note reviewed.  Constitutional:      Appearance: She is well-developed.  HENT:     Head: Normocephalic and atraumatic.  Eyes:     Conjunctiva/sclera: Conjunctivae normal.  Cardiovascular:     Rate and Rhythm: Normal rate and regular rhythm.     Heart sounds: Normal heart sounds.  Pulmonary:     Effort: Pulmonary effort is normal.     Breath sounds: Normal breath sounds.  Abdominal:     General: Bowel sounds are normal.     Palpations: Abdomen is soft.  Musculoskeletal:     Cervical back: Normal range of motion.  Lymphadenopathy:     Cervical: No cervical adenopathy.  Skin:    General: Skin is warm and dry.     Capillary Refill: Capillary refill takes less than 2 seconds.  Neurological:     Mental Status: She is alert and oriented to person, place, and time.  Psychiatric:         Behavior: Behavior normal.      Musculoskeletal Exam: C-spine, thoracic spine, lumbar spine have good range of motion.  No midline spinal tenderness.  No SI joint tenderness.  Shoulder joints, elbow joints, and wrist joints have good range of motion.  CMC joint thickening and subluxation bilaterally.  Thickening of the right third MCP joint but no synovitis noted.  Tenderness of the right 2nd, 3rd, and 4th MCP and PIP joints.  Right trigger thumb noted.  Thickening of the flexor tendon of the right third digit.  Complete fist formation bilaterally.  Hip joints have good range of motion with no groin pain.  Knee joints have good range of motion no warmth or effusion.  Ankle joints have  good range of motion no tenderness or joint swelling.     CDAI Exam: CDAI Score: -- Patient Global: --; Provider Global: -- Swollen: --; Tender: -- Joint Exam 03/13/2024   No joint exam has been documented for this visit   There is currently no information documented on the homunculus. Go to the Rheumatology activity and complete the homunculus joint exam.  Investigation: No additional findings.  Imaging: No results found.  Recent Labs: Lab Results  Component Value Date   WBC 6.0 11/15/2023   HGB 13.5 11/15/2023   PLT 312 11/15/2023   NA 142 11/15/2023   K 4.7 11/15/2023   CL 106 11/15/2023   CO2 26 11/15/2023   GLUCOSE 89 11/15/2023   BUN 16 11/15/2023   CREATININE 0.79 11/15/2023   BILITOT 0.3 11/15/2023   AST 14 11/15/2023   ALT 14 11/15/2023   PROT 6.5 11/15/2023   CALCIUM 9.3 11/15/2023   GFRAA >60 09/18/2015    Speciality Comments: No specialty comments available.  Procedures:  No procedures performed Allergies: Bee venom and Penicillins   Assessment / Plan:     Visit Diagnoses: Positive ANA (antinuclear antibody) - ANA was low titer positive, ENA negative, complements normal:  Lab work from 11/15/2023 was reviewed today in the office: ESR within normal limits, RNP  negative, Ro antibody negative, La antibody negative, double-stranded DNA negative, complements within normal limits, uric acid within normal limits, smith negative. She has no clinical features of systemic lupus at this time.  She was advised to notify us  if she develops any new or worsening symptoms.  She will follow-up in the office in 6 months or sooner if needed.  Can consider obtaining updated rheumatologic lab work at that time.  Arthritis of carpometacarpal (CMC) joints of both thumbs: She has CMC joint thickening and subluxation bilaterally.  Under the care of Dr. Delene.  Considering surgical intervention.  She has been taking tylenol  1 to 2 tablets daily as needed for pain relief.  She was started on meloxicam 15 mg 1 tablet at bedtime about 2 weeks ago which has helped to alleviate nocturnal pain.  She is considering taking meloxicam in the morning to try to alleviate her daytime symptoms since she has had more difficulty with functionality and pain after overuse or repetitive activities.  She has no synovitis on examination today.  Pain in both hands - CMC subluxation, severe OA changes, generalized osteopenia and right third MCP narrowing.  Ultrasound of both hands consistent with synovitis and tenosynovitis on 01/26/24.  No synovitis noted on examination today.  She is considering proceeding with right trigger finger release and will be following up with a hand center on 04/03/2024. She plans on continuing to take meloxicam 15 mg 1 tablet daily.  She will also be using arthritis compression gloves and may try the paraffin wax soaks at home.  She will notify us  if she develops any new or worsening symptoms.  Status post total hip replacement, right: Doing well.  No groin pain.    Status post total left knee replacement: Doing well.  Good ROM with no warmth or effusion.   Primary osteoarthritis of both feet - She had bilateral first MTP narrowing and PIP and DIP narrowing suggestive of  osteoarthritis.  She has good range of motion of both ankle joints with no tenderness or joint swelling.  Other medical conditions are listed as follows:  Primary hypertension: Blood pressure was elevated today in the office was rechecked prior to leaving.  Patient was advised to monitor blood pressure closely to reach out to PCP blood pressure remains elevated.  Gastroesophageal reflux disease without esophagitis  Anxiety and depression  Status post hysterectomy  Orders: No orders of the defined types were placed in this encounter.  No orders of the defined types were placed in this encounter.    Follow-Up Instructions: Return in about 6 months (around 09/11/2024) for Osteoarthritis.   Waddell CHRISTELLA Craze, PA-C  Note - This record has been created using Dragon software.  Chart creation errors have been sought, but may not always  have been located. Such creation errors do not reflect on  the standard of medical care.

## 2024-03-13 ENCOUNTER — Ambulatory Visit: Attending: Physician Assistant | Admitting: Physician Assistant

## 2024-03-13 ENCOUNTER — Encounter: Payer: Self-pay | Admitting: Physician Assistant

## 2024-03-13 VITALS — BP 161/96 | HR 73 | Temp 97.2°F | Resp 12 | Ht 63.0 in | Wt 196.2 lb

## 2024-03-13 DIAGNOSIS — Z90710 Acquired absence of both cervix and uterus: Secondary | ICD-10-CM

## 2024-03-13 DIAGNOSIS — I10 Essential (primary) hypertension: Secondary | ICD-10-CM

## 2024-03-13 DIAGNOSIS — F419 Anxiety disorder, unspecified: Secondary | ICD-10-CM

## 2024-03-13 DIAGNOSIS — M19072 Primary osteoarthritis, left ankle and foot: Secondary | ICD-10-CM

## 2024-03-13 DIAGNOSIS — R7689 Other specified abnormal immunological findings in serum: Secondary | ICD-10-CM

## 2024-03-13 DIAGNOSIS — Z96641 Presence of right artificial hip joint: Secondary | ICD-10-CM

## 2024-03-13 DIAGNOSIS — Z96652 Presence of left artificial knee joint: Secondary | ICD-10-CM

## 2024-03-13 DIAGNOSIS — M79641 Pain in right hand: Secondary | ICD-10-CM

## 2024-03-13 DIAGNOSIS — M180 Bilateral primary osteoarthritis of first carpometacarpal joints: Secondary | ICD-10-CM

## 2024-03-13 DIAGNOSIS — K219 Gastro-esophageal reflux disease without esophagitis: Secondary | ICD-10-CM

## 2024-03-13 DIAGNOSIS — M19071 Primary osteoarthritis, right ankle and foot: Secondary | ICD-10-CM

## 2024-09-18 ENCOUNTER — Ambulatory Visit: Admitting: Rheumatology
